# Patient Record
Sex: Female | Born: 1980 | Race: Black or African American | Hispanic: No | Marital: Single | Smoking: Never smoker
Health system: Southern US, Community
[De-identification: ages and names within clinical notes are randomized; demographics above are authoritative.]

## PROBLEM LIST (undated history)

## (undated) DIAGNOSIS — E669 Obesity, unspecified: Secondary | ICD-10-CM

## (undated) DIAGNOSIS — Z973 Presence of spectacles and contact lenses: Secondary | ICD-10-CM

## (undated) DIAGNOSIS — E119 Type 2 diabetes mellitus without complications: Secondary | ICD-10-CM

## (undated) DIAGNOSIS — O0990 Supervision of high risk pregnancy, unspecified, unspecified trimester: Secondary | ICD-10-CM

## (undated) DIAGNOSIS — Z975 Presence of (intrauterine) contraceptive device: Secondary | ICD-10-CM

## (undated) HISTORY — DX: Obesity, unspecified: E66.9

## (undated) HISTORY — DX: Presence of (intrauterine) contraceptive device: Z97.5

## (undated) HISTORY — DX: Supervision of high risk pregnancy, unspecified, unspecified trimester: O09.90

## (undated) HISTORY — DX: Presence of spectacles and contact lenses: Z97.3

## (undated) HISTORY — PX: BACK SURGERY: SHX140

## (undated) HISTORY — PX: CERVICAL CERCLAGE: SHX1329

---

## 2002-07-29 HISTORY — PX: CYST EXCISION: SHX5701

## 2004-07-29 DIAGNOSIS — Z975 Presence of (intrauterine) contraceptive device: Secondary | ICD-10-CM

## 2004-07-29 HISTORY — DX: Presence of (intrauterine) contraceptive device: Z97.5

## 2008-07-29 HISTORY — PX: DILATION AND CURETTAGE OF UTERUS: SHX78

## 2014-01-17 ENCOUNTER — Telehealth: Payer: Self-pay | Admitting: Family Medicine

## 2014-01-17 ENCOUNTER — Ambulatory Visit (INDEPENDENT_AMBULATORY_CARE_PROVIDER_SITE_OTHER): Payer: Managed Care, Other (non HMO) | Admitting: Medical

## 2014-01-17 ENCOUNTER — Encounter: Payer: Self-pay | Admitting: Medical

## 2014-01-17 VITALS — BP 110/80 | HR 60 | Temp 98.3°F | Resp 16 | Ht 65.0 in | Wt 252.0 lb

## 2014-01-17 DIAGNOSIS — F411 Generalized anxiety disorder: Secondary | ICD-10-CM

## 2014-01-17 DIAGNOSIS — F4321 Adjustment disorder with depressed mood: Secondary | ICD-10-CM

## 2014-01-17 MED ORDER — CITALOPRAM HYDROBROMIDE 40 MG PO TABS: ORAL_TABLET | ORAL | Status: DC

## 2014-01-17 NOTE — Progress Notes (Signed)
Subjective: New patient today.  Here for stress and anxiety.  Works full time at [employer].   Lives at home with her mother and 32yo daughter.   She feels overwhelmed.  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  In addition, working full time, raising 32yo daughter.   Feels pulled in so many directions.   Wants to take a leave of absence, has FMLA forms with her today. Had to take some time off work last year for same.   Her manager at work encouraged her to come in given that she has frequent crying spells, seems mentally distant from work although she is physically there.   Amy Matthews notes a breaking point though over [employer] day.   Her step father and grandmother whom she was close to (both of them) actually died the same day at the same hospital [place].   Her grandmother raised her along with her mother, so she was particularly close to her.  Given all her stressors, she doesn't feel like she has been able to grieve the losses.   Brother lives in [place] but is not helpful.  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   No prior treatment for anxiety, depression, no prior SSRIs.   No prior counseling.   Lived here in [place] 5 years.  No other recent medical care.    Objective: Gen: wd, wn, nad Psych: crying at times, pleasant, otherwise good eye contact, answers questions appropriately   Assessment: Encounter Diagnoses  Name Primary?  Marland Kitchen. Anxiety state, unspecified Yes  . Grieving      Plan: Discussed her situation, concerns, need to grieve and deal with her emotional issues.   Will refer to counseling.  discussed medication that can help symptoms.  Begin Citalopram.  Discussed risks/benefits of medication, discussed proper use, followup.  Advised she look into home health or other medical resources for her mother.  I gave her the FMLA forms back to take to counseling to help better  determine need for time off.  Recheck 3-4 wk, sooner for any reason.

## 2014-01-17 NOTE — Patient Instructions (Signed)
Counseling services   [facility] [phone] office [email or web address] [address], [place], [place] [address]  Laura Atkinson, therapist  Erik Nelson, MA, clinical psychologist  Cognitive-Behavior Therapy; Mood Disorders; Anxiety Disorders; adult and child ADHD; Family Therapy; Stress Management; personal growth, and Marital Therapy.    Dennis McKnight Ph.D., clinical psychologist Cognitive-Behavior Therapy; Mood Disorders; Anxiety Disorders; Stress     Management   [facility] [address], [place], [place] [address] [phone]   [facility] Desiree Wilkinson, psychotherapist [address] [place], [place] [address] [phone]   Elaine Talbert Ph.D., clinical psychologist [phone] office [address] [place], [place] [address] Cognitive Behavior Therapy, Depression, Bipolar, Anxiety, Grief and Loss   

## 2014-01-17 NOTE — Telephone Encounter (Signed)
PT called wanting to give [employer] copy of her records.  Monia Pouchetna is handling the FMLA for her.  I advised patient that she will need to sign a release for any records to go to [place].  It appears Vincenza HewsShane has advised pt that she needs to go to counseling and that the counselor should fill out the [facility].  Pt will bring release in tomorrow.

## 2014-01-17 NOTE — Telephone Encounter (Signed)
Vincenza HewsShane, The people from the [facility] called and they took the patients information and said they would give this patient a call and file on her insurance. CLS

## 2014-01-18 ENCOUNTER — Telehealth: Payer: Self-pay | Admitting: Family Medicine

## 2014-01-18 NOTE — Telephone Encounter (Signed)
Pt came in, brought [facility] of medical records [facility].  She gave me a new set of FMLA forms and took the [employer] [employer] forms that we had.  I explained to her that we were not gong to fill out the FMLA forms that she needed to take them to her counselor.  She said she understood, but these neeeded to go in asap.  I explained we could write on them that the counselor needed to fill out the [facility] and she said that was fine, to fax that. [facility]

## 2014-02-15 ENCOUNTER — Telehealth: Payer: Self-pay | Admitting: Medical

## 2014-02-15 ENCOUNTER — Telehealth: Payer: Self-pay | Admitting: Family Medicine

## 2014-02-15 NOTE — Telephone Encounter (Signed)
[employer] (FMLA) called about this patient. She was referred to counseling and they are suppose to fill out the FMLA forms. [employer] rep. Olegario MessierKathy ask if you could fill out the forms for the patient missing work from June 11th to June 26th. I explain to her that I wasn't for sure if you would do this. Olegario MessierKathy @ [phone] at [facility]. CLS

## 2014-02-15 NOTE — Telephone Encounter (Signed)
Pt called stating that due to FMLA with [employer] and the dates that were completing on the FMLA form, Monia Pouchetna is giving her a deadline of tomorrow morning to get get a letter to them stating that she was out of work form 01/06/14 - 01/20/14 due to stress and the same FMLA reasons. Pt says during the period of 6/11-6/25 she was trying to get in with a new doctor and get seen for the FMLA reasons.and it just took a while.Requesting a letter today so that she does not lose her job. Pt noting that this entire situation is causing her a lot of stress.

## 2014-02-16 NOTE — Telephone Encounter (Signed)
Patient goes back to work on 02/18/14. She has a follow up here with you on 02/17/14. She goes to [facility] and she see's Mila Palmeresiree Briggs , and she meets with her every Thursday. I spoke with the lady at [facility] and all she needed was a verbal over the phone that you would take care of those dates 01/06/14 to 01/20/14. CLS

## 2014-02-16 NOTE — Telephone Encounter (Signed)
I left a message for kathy at [facility] to return my phone call about this patient. CLS

## 2014-02-16 NOTE — Telephone Encounter (Signed)
Ok, I will write for the specific dates, but I don't have the forms.  Has she went back to work?  Is she now seeing the counselor?  Who, contact info, how often?  She is due back for f/u on medication, so make the appt.

## 2014-02-17 ENCOUNTER — Ambulatory Visit: Payer: Self-pay | Admitting: Medical

## 2014-02-21 ENCOUNTER — Ambulatory Visit: Payer: Self-pay | Admitting: Medical

## 2014-03-02 ENCOUNTER — Encounter: Payer: Self-pay | Admitting: Medical

## 2014-03-31 ENCOUNTER — Telehealth: Payer: Self-pay | Admitting: Medical

## 2014-03-31 NOTE — Telephone Encounter (Signed)
Please help -  I received a letter from [employer].  I can't tell if the record is stating that they won't pay for her visit or if they were trying to get more information or what.  Can you please review the letter, send whatever records they're requesting, and if need be I can get on the telephone to talk to the claims person.  There was notation that they tried to call me personally twice which I never got sent a phone call to respond to.  There is notation about them getting  In touch with our front office, something about someone being out of the office, not sure.    Either way the patient is due for a followup visit, I have only seen her once, and if we need to talk with the representative that is fine.

## 2014-04-06 NOTE — Telephone Encounter (Signed)
Letter from Monia Pouch is regarding pt's functionality and impairment, it states needs you to review the report and see if you agree with the findings or is you disagree you need to provide clinical evidence or document your opinion.  Letter on your desk. Also I left a message for pt to call and schedule an appt.

## 2014-04-08 NOTE — Telephone Encounter (Signed)
Please write a letter or call back or give some sort of correspondance to the [facility] request.  Of note, I was never made aware that Monia Pouch called as they said they called so I was unable to know to respond to them, although they mention speaking to the front office.   I am not a psychiatrist or psychologist, and as such I are documentation maybe a little different than one would see with those specialties.  I try to be thorough in the limited time I have 15 minutes with this new patient at her first and only visit so far.    Regarding how her symptoms of anxiety are affecting her functioning, she feels or was feeling overwhelmed, being pulled in multiple directions, numerous responsibilities at home including help taking care of her mother, single mom, works full-time.  Due to having random and frequent crying spells on the job, she was unable to do her day to day work without crying, being self conscious about crying spells, unable to focus on her job, feeling distant, non-engaged. Was getting behind in her work load due to the symptoms, mostly emotional and mental, not necessarily physical symptoms.  Hopefully this information will suffice with whatever information they're requesting  shane

## 2014-04-14 ENCOUNTER — Telehealth: Payer: Self-pay | Admitting: Medical

## 2014-04-14 NOTE — Telephone Encounter (Signed)
Called [employer] appeals specialist at [place] to discuss forms regarding appeal.  Per Elane Fritz due to no response from [place] so appeal was denied.  Too late to do anything else.

## 2014-04-24 NOTE — Telephone Encounter (Signed)
See below, has this been done

## 2014-04-25 NOTE — Telephone Encounter (Signed)
See note my note 04/14/14

## 2014-08-23 ENCOUNTER — Telehealth: Payer: Self-pay | Admitting: Medical

## 2014-08-23 NOTE — Telephone Encounter (Signed)
Patient states that it's time for the IUD to be removed. She has an appointment to come in on 2/4/116

## 2014-08-23 NOTE — Telephone Encounter (Signed)
LMOM TO CB. CLS 

## 2014-08-23 NOTE — Telephone Encounter (Signed)
Requesting the name of a OBGYN where she can get her IUD removed

## 2014-08-23 NOTE — Telephone Encounter (Signed)
1) I've only seen her once, so due for physical, so schedule this 2) if Mirena IUD, I can remove it, but what is her concern for having it removed?

## 2014-09-01 ENCOUNTER — Other Ambulatory Visit (HOSPITAL_COMMUNITY)
Admission: RE | Admit: 2014-09-01 | Discharge: 2014-09-01 | Disposition: A | Discharge status: Home / Self Care | Payer: BLUE CROSS/BLUE SHIELD | Source: Ambulatory Visit | Attending: Medical | Admitting: Medical

## 2014-09-01 ENCOUNTER — Ambulatory Visit (INDEPENDENT_AMBULATORY_CARE_PROVIDER_SITE_OTHER): Payer: BLUE CROSS/BLUE SHIELD | Admitting: Medical

## 2014-09-01 ENCOUNTER — Encounter: Payer: Self-pay | Admitting: Medical

## 2014-09-01 VITALS — BP 128/80 | HR 76 | Temp 98.0°F | Resp 15 | Ht 65.0 in | Wt 244.0 lb

## 2014-09-01 DIAGNOSIS — Z708 Other sex counseling: Secondary | ICD-10-CM

## 2014-09-01 DIAGNOSIS — Z719 Counseling, unspecified: Secondary | ICD-10-CM

## 2014-09-01 DIAGNOSIS — Z113 Encounter for screening for infections with a predominantly sexual mode of transmission: Secondary | ICD-10-CM | POA: Diagnosis present

## 2014-09-01 DIAGNOSIS — R81 Glycosuria: Secondary | ICD-10-CM

## 2014-09-01 DIAGNOSIS — Z01419 Encounter for gynecological examination (general) (routine) without abnormal findings: Secondary | ICD-10-CM | POA: Insufficient documentation

## 2014-09-01 DIAGNOSIS — Z1151 Encounter for screening for human papillomavirus (HPV): Secondary | ICD-10-CM | POA: Diagnosis present

## 2014-09-01 DIAGNOSIS — Z7189 Other specified counseling: Secondary | ICD-10-CM

## 2014-09-01 DIAGNOSIS — Z2821 Immunization not carried out because of patient refusal: Secondary | ICD-10-CM

## 2014-09-01 DIAGNOSIS — Z30432 Encounter for removal of intrauterine contraceptive device: Secondary | ICD-10-CM

## 2014-09-01 DIAGNOSIS — Z124 Encounter for screening for malignant neoplasm of cervix: Secondary | ICD-10-CM

## 2014-09-01 DIAGNOSIS — Z23 Encounter for immunization: Secondary | ICD-10-CM

## 2014-09-01 DIAGNOSIS — E669 Obesity, unspecified: Secondary | ICD-10-CM

## 2014-09-01 DIAGNOSIS — Z0000 Encounter for general adult medical examination without abnormal findings: Secondary | ICD-10-CM

## 2014-09-01 DIAGNOSIS — Z833 Family history of diabetes mellitus: Secondary | ICD-10-CM

## 2014-09-01 DIAGNOSIS — L680 Hirsutism: Secondary | ICD-10-CM

## 2014-09-01 LAB — CBC
HEMATOCRIT: 43.5 % (ref 36.0–46.0)
HEMOGLOBIN: 14.3 g/dL (ref 12.0–15.0)
MCH: 29.7 pg (ref 26.0–34.0)
MCHC: 32.9 g/dL (ref 30.0–36.0)
MCV: 90.4 fL (ref 78.0–100.0)
MPV: 11 fL (ref 8.6–12.4)
PLATELETS: 302 10*3/uL (ref 150–400)
RBC: 4.81 MIL/uL (ref 3.87–5.11)
RDW: 13 % (ref 11.5–15.5)
WBC: 4.5 10*3/uL (ref 4.0–10.5)

## 2014-09-01 LAB — POCT URINALYSIS DIPSTICK
Bilirubin, UA: NEGATIVE
KETONES UA: NEGATIVE
LEUKOCYTES UA: NEGATIVE
Nitrite, UA: NEGATIVE
PROTEIN UA: NEGATIVE
Spec Grav, UA: >=1.03
Urobilinogen, UA: NEGATIVE
pH, UA: 6

## 2014-09-01 LAB — POCT URINE PREGNANCY: Preg Test, Ur: NEGATIVE

## 2014-09-01 NOTE — Progress Notes (Addendum)
Subjective:   HPI  Amy Matthews is a 34 y.o. female who presents for a complete physical.  Accompanied by her boyfriend.   Works at [employer], has young daughter.   Preventative care: Last ophthalmology visit:2016 [facility] Last dental visit: [facility] 2014 Last colonoscopy:never Last mammogram:never Last gynecological exam:2 years ago, pap normal then.  No prior abnormal pap Last JXB:JYNWGEKG:never Last labs:2016  Prior vaccinations: TD or Tdap:current Influenza:no, declines Pneumococcal: no Shingles/Zostavax:no  Concerns: Wants IUD removed.   Wants a little break from contraception.  No particular reason or concern.  Not wanting to restart contraception at this time.   Is sexually active.   Reviewed their medical, surgical, family, social, medication, and allergy history and updated chart as appropriate.  Past Medical History  Diagnosis Date  . Wears contact lenses   . Obesity   . HRP (high risk pregnancy)     prolonged hospitalization, cerclage done  . IUD (intrauterine device) in place 2006    Mirena    Past Surgical History  Procedure Laterality Date  . Dilation and curettage of uterus  2010    due to miscarriage  . Cyst excision  2004    under right breast  . Cervical cerclage      History   Social History  . Marital Status: Single    Spouse Name: N/A    Number of Children: N/A  . Years of Education: N/A   Occupational History  . Not on file.   Social History Main Topics  . Smoking status: Never Smoker   . Smokeless tobacco: Not on file  . Alcohol Use: Yes     Comment: social  . Drug Use: No  . Sexual Activity: Not on file   Other Topics Concern  . Not on file   Social History Narrative   Lives with boyfriend, has 1 daughter 5yo as of 07/2014.  Exercise - not currently but planning to 2016.   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 Kidney disease Mother      dialysis  . Diabetes Mother   . Ulcers Father   . Heart disease Maternal Grandmother   . Other Maternal Grandfather     old age  . Dementia Paternal Grandmother   . Hypertension Neg Hx   . Hyperlipidemia Neg Hx   . Stroke Neg Hx   . Cancer Other     breast     Current outpatient prescriptions:  .  citalopram (CELEXA) 40 MG tablet, 1/2 tablet QHS x 1wk, then 1 tablet po QHS (Patient not taking: Reported on 09/01/2014), Disp: 30 tablet, Rfl: 2  No Known Allergies   Review of Systems Constitutional: -fever, -chills, -sweats, -unexpected weight change, -decreased appetite, -fatigue Allergy: -sneezing, -itching, -congestion Dermatology: -changing moles, --rash, -lumps ENT: -runny nose, -ear pain, -sore throat, -hoarseness, -sinus pain, -teeth pain, - ringing in ears, -hearing loss, -nosebleeds Cardiology: -chest pain, -palpitations, -swelling, -difficulty breathing when lying flat, -waking up short of breath Respiratory: -cough, -shortness of breath, -difficulty breathing with exercise or exertion, -wheezing, -coughing up blood Gastroenterology: -abdominal pain, -nausea, -vomiting, -diarrhea, -constipation, -blood in stool, -changes in bowel movement, -difficulty swallowing or eating Hematology: -bleeding, -bruising  Musculoskeletal: -joint aches, -muscle aches, -joint swelling, -back pain, -neck pain, -cramping, -changes in gait Ophthalmology: denies vision changes, eye redness, itching, discharge Urology: -burning with urination, -difficulty urinating, -blood in urine, -urinary frequency, -urgency, -incontinence  Neurology: -headache, -weakness, -tingling, -numbness, -memory loss, -falls, -dizziness Psychology: -depressed mood, -agitation, -sleep problems     Objective:   Physical Exam  Temp(Src) 98 F (36.7 C) (Oral)  Ht  (1.651 m)  Wt 244 lb (110.678 kg)  BMI 40.60 kg/m2  General appearance: alert, no distress, WD/WN, obese AA female Skin: increased body hair -  face/female beard pattern, abdomen with increased hair, lower left neck and mid chest across sternum with slightly darker pigmentation unchanged since birth per patient HEENT: normocephalic, conjunctiva/corneas normal, sclerae anicteric, PERRLA, EOMi, nares patent, no discharge or erythema, pharynx normal Oral cavity: MMM, tongue normal, teeth normal, in good repair Neck: supple, no lymphadenopathy, no thyromegaly, no masses, normal ROM, no bruits Chest: non tender, normal shape and expansion Heart: RRR, normal S1, S2, no murmurs Lungs: CTA bilaterally, no wheezes, rhonchi, or rales Abdomen: +bs, soft, non tender, non distended, no masses, no hepatomegaly, no splenomegaly, no bruits Back: non tender, normal ROM, no scoliosis Musculoskeletal: upper extremities non tender, no obvious deformity, normal ROM throughout, lower extremities non tender, no obvious deformity, normal ROM throughout Extremities: no edema, no cyanosis, no clubbing Pulses: 2+ symmetric, upper and lower extremities, normal cap refill Neurological: alert, oriented x 3, CN2-12 intact, strength normal upper extremities and lower extremities, sensation normal throughout, DTRs 2+ throughout, no cerebellar signs, gait normal Psychiatric: normal affect, behavior normal, pleasant  Breast: nontender, no masses or lumps, no skin changes, no nipple discharge or inversion, no axillary lymphadenopathy Gyn: Normal external genitalia without lesions, vagina with normal mucosa, cervix without lesions, no cervical motion tenderness, no abnormal vaginal discharge.  Uterus and adnexa not enlarged, nontender, no masses.  IUD strings in place.   Pap performed.  Exam chaperoned by nurse. Rectal: deferred    Assessment and Plan :    Encounter Diagnoses  Name Primary?  . Encounter for health maintenance examination in adult Yes  . Encounter for IUD removal   . Screening for cervical cancer   . Obesity   . Family history of diabetes mellitus in  first degree relative   . Screen for STD (sexually transmitted disease)   . Counseling on sexually transmitted disease   . Health counseling   . Hirsutism   . Need for Tdap vaccination   . Influenza vaccination declined by patient      Physical exam - discussed healthy lifestyle, diet, exercise, preventative care, vaccinations, and addressed their concerns.  Handout given. See your eye doctor yearly for routine vision care. See your dentist yearly for routine dental care including hygiene visits twice yearly. Routine labs today, STD screening labs, pap sent Obesity - discussed need to work on healthy diet, exercise, weight loss Discussed STD prevention, safe sex, contraception Counseled on the Tdap (tetanus, diptheria, and acellular pertussis) vaccine.  Vaccine information sheet given. Tdap vaccine given after consent obtained. Declines flu vaccine despite the benefits.    Procedure: IUD - discussed contraception.  This month marks 5 years of IUD in place per patient, Mirena.  She consents for removal.  discussed risks/benefits of procedure. Cleaned and prepped the cervix with Betadine, used ring forceps with steady tension to remove the IUD.  IUD removed successfully with ring forceps.   Patient tolerated procedure without c/o.  Follow-up pending labs

## 2014-09-01 NOTE — Addendum Note (Signed)
Addended by: Lilli LightLOMAX, WENDY G on: 09/01/2014 10:34 AM   Modules accepted: Orders

## 2014-09-01 NOTE — Patient Instructions (Signed)
Thank you for giving me the opportunity to serve you today.    Your diagnosis today includes: Encounter Diagnoses  Name Primary?  . Encounter for health maintenance examination in adult Yes  . Encounter for IUD removal   . Screening for cervical cancer   . Obesity   . Family history of diabetes mellitus in first degree relative   . Screen for STD (sexually transmitted disease)   . Counseling on sexually transmitted disease   . Health counseling   . Hirsutism   . Need for Tdap vaccination   . Influenza vaccination declined by patient   . Glucosuria     Specific recommendations today include: See your dentist yearly for routine dental care including hygiene visits twice yearly. See your eye doctor yearly for routine vision care. We updated your tetanus diphtheria and pertussis vaccine today We will call with lab results and Pap smear I am concerned about diabetes as you have sugar in the urine today I certainly recommend eating a low-fat healthy diet, exercising daily, and working on weight loss efforts Consider condoms and other backup methods of contraception since the IUD is now removed You may experience a little bleeding, or spotting vaginally as well as some cramping the next day or 2. This is normal. However if severe pain, heavy bleeding then return right away. Can take a little ibuprofen the next few days There was hirsutism today which included a handout below about this. This could be related to ovarian cyst or impaired sugar. We will call with lab results and other recommendations  Return pending labs.    I have included other useful information below for your review.   Contraceptive Barrier Methods A barrier method is a type of birth control (contraception) that is used to prevent pregnancy. These methods include:   Female condom.   Female condom.   Diaphragm.   Cervical cap.   Sponge.   Spermicide.  Your health care provider can help you decide  what form of contraception is best for you. Always keep in mind the risks of sexually transmitted infections (STIs).  FEMALE CONDOM A female condom is a thin sheath (latex or rubber) that is worn over the penis during sexual intercourse. The condom prevents pregnancy by catching and stopping the sperm from reaching the uterus. Condoms may come with a spermicide on them, and they can only be worn once. Condoms should not be used with petroleum jelly, lotions, or oils. These things decrease their effectiveness. Condoms can be used with water-based lubricants. Condoms help protect against STIs. Latex and polyurethane condoms provide the best available protection against many STIs, including HIV.  FEMALE CONDOM The female condom is a soft, loose-fitting sheath that is put into the vagina before sexual intercourse. It prevents pregnancy by catching the sperm in the condom and blocking the passage of sperm to the uterus. It is intended for one-time use only. A female partner should not use a condom at the same time. The female and female condoms may stick together and break. A female condom can be inserted as long as 8 hours before intercourse. Condoms help protect against STIs.  DIAPHRAGM A diaphragm is a soft, latex, dome-shaped barrier that is placed in the vagina with spermicidal jelly before sexual intercourse. It covers the cervix, kills sperm, and blocks the passage of semen into the cervix. The diaphragm can be inserted up to 2 hours before sex. If it is inserted more than 2 hours before intercourse, then spermicide must  be applied again. The diaphragm should be left in the vagina for 6-8 hours after intercourse. It must be fitted by a health care provider. This method does not protect against STIs.  CERVICAL CAP A cervical cap is a round, soft, latex or plastic cup that is put in the vagina and fits over the cervix. It may be inserted as long as 6 hours before sexual activity. It must be left in place for at  least 6 hours after intercourse and can be left in place for as long as 48 hours. It provides continuous protection as long as it is in place, regardless of the number of intercourse acts. The cervical cap cannot be used during your period. It must be fitted by a health care provider. Cervical caps do not protect against STIs. SPONGE A sponge is a soft, circular piece of polyurethane foam that has spermicide in it. The sponge has a loop for removal. It is inserted into the vagina after wetting it and is placed over the cervix before sexual intercourse. The foam is designed to trap and absorb sperm before it enters the cervix. The spermicide kills or immobilizes sperm. The sponge offers an immediate and continuous presence of spermicide throughout a 24-hour period regardless of the number of intercourse acts. The sponge should be left in place for at least 6 hours after sex. It should not be left in for more than 24 hours, and it cannot be reused. The sponge does not protect against STIs. SPERMICIDES Spermicides are chemicals that kill or block sperm from entering the cervix and uterus. They come in the form of creams, jellies, suppositories, foam, film, or tablets. The film, tablets, and suppositories should be inserted 10 to 30 minutes before sexual intercourse so they can dissolve. They are inserted into the vagina with an applicator before having sexual intercourse. This must be repeated every time you have sexual intercourse. The use of spermicides does not protect against STIs. Document Released: 05/12/2007 Document Revised: 03/17/2013 Document Reviewed: 12/27/2012 [facility] Patient Information 2015 [place]. This information is not intended to replace advice given to you by your health care provider. Make sure you discuss any questions you have with your health care provider.    Contraception Choices Contraception (birth control) is the use of any methods or devices to prevent pregnancy. Below  are some methods to help avoid pregnancy. HORMONAL METHODS   Contraceptive implant. This is a thin, plastic tube containing progesterone hormone. It does not contain estrogen hormone. Your health care provider inserts the tube in the inner part of the upper arm. The tube can remain in place for up to 3 years. After 3 years, the implant must be removed. The implant prevents the ovaries from releasing an egg (ovulation), thickens the cervical mucus to prevent sperm from entering the uterus, and thins the lining of the inside of the uterus.  Progesterone-only injections. These injections are given every 3 months by your health care provider to prevent pregnancy. This synthetic progesterone hormone stops the ovaries from releasing eggs. It also thickens cervical mucus and changes the uterine lining. This makes it harder for sperm to survive in the uterus.  Birth control pills. These pills contain estrogen and progesterone hormone. They work by preventing the ovaries from releasing eggs (ovulation). They also cause the cervical mucus to thicken, preventing the sperm from entering the uterus. Birth control pills are prescribed by a health care provider.Birth control pills can also be used to treat heavy periods.  Minipill. This type of birth control pill contains only the progesterone hormone. They are taken every day of each month and must be prescribed by your health care provider.  Birth control patch. The patch contains hormones similar to those in birth control pills. It must be changed once a week and is prescribed by a health care provider.  Vaginal ring. The ring contains hormones similar to those in birth control pills. It is left in the vagina for 3 weeks, removed for 1 week, and then a new one is put back in place. The patient must be comfortable inserting and removing the ring from the vagina.A health care provider's prescription is necessary.  Emergency contraception. Emergency contraceptives  prevent pregnancy after unprotected sexual intercourse. This pill can be taken right after sex or up to 5 days after unprotected sex. It is most effective the sooner you take the pills after having sexual intercourse. Most emergency contraceptive pills are available without a prescription. Check with your pharmacist. Do not use emergency contraception as your only form of birth control. BARRIER METHODS   Female condom. This is a thin sheath (latex or rubber) that is worn over the penis during sexual intercourse. It can be used with spermicide to increase effectiveness.  Female condom. This is a soft, loose-fitting sheath that is put into the vagina before sexual intercourse.  Diaphragm. This is a soft, latex, dome-shaped barrier that must be fitted by a health care provider. It is inserted into the vagina, along with a spermicidal jelly. It is inserted before intercourse. The diaphragm should be left in the vagina for 6 to 8 hours after intercourse.  Cervical cap. This is a round, soft, latex or plastic cup that fits over the cervix and must be fitted by a health care provider. The cap can be left in place for up to 48 hours after intercourse.  Sponge. This is a soft, circular piece of polyurethane foam. The sponge has spermicide in it. It is inserted into the vagina after wetting it and before sexual intercourse.  Spermicides. These are chemicals that kill or block sperm from entering the cervix and uterus. They come in the form of creams, jellies, suppositories, foam, or tablets. They do not require a prescription. They are inserted into the vagina with an applicator before having sexual intercourse. The process must be repeated every time you have sexual intercourse. INTRAUTERINE CONTRACEPTION  Intrauterine device (IUD). This is a T-shaped device that is put in a woman's uterus during a menstrual period to prevent pregnancy. There are 2 types:  Copper IUD. This type of IUD is wrapped in copper  wire and is placed inside the uterus. Copper makes the uterus and fallopian tubes produce a fluid that kills sperm. It can stay in place for 10 years.  Hormone IUD. This type of IUD contains the hormone progestin (synthetic progesterone). The hormone thickens the cervical mucus and prevents sperm from entering the uterus, and it also thins the uterine lining to prevent implantation of a fertilized egg. The hormone can weaken or kill the sperm that get into the uterus. It can stay in place for 3-5 years, depending on which type of IUD is used. PERMANENT METHODS OF CONTRACEPTION  Female tubal ligation. This is when the woman's fallopian tubes are surgically sealed, tied, or blocked to prevent the egg from traveling to the uterus.  Hysteroscopic sterilization. This involves placing a small coil or insert into each fallopian tube. Your doctor uses a technique called hysteroscopy to  do the procedure. The device causes scar tissue to form. This results in permanent blockage of the fallopian tubes, so the sperm cannot fertilize the egg. It takes about 3 months after the procedure for the tubes to become blocked. You must use another form of birth control for these 3 months.  Female sterilization. This is when the female has the tubes that carry sperm tied off (vasectomy).This blocks sperm from entering the vagina during sexual intercourse. After the procedure, the man can still ejaculate fluid (semen). NATURAL PLANNING METHODS  Natural family planning. This is not having sexual intercourse or using a barrier method (condom, diaphragm, cervical cap) on days the woman could become pregnant.  Calendar method. This is keeping track of the length of each menstrual cycle and identifying when you are fertile.  Ovulation method. This is avoiding sexual intercourse during ovulation.  Symptothermal method. This is avoiding sexual intercourse during ovulation, using a thermometer and ovulation  symptoms.  Post-ovulation method. This is timing sexual intercourse after you have ovulated. Regardless of which type or method of contraception you choose, it is important that you use condoms to protect against the transmission of sexually transmitted infections (STIs). Talk with your health care provider about which form of contraception is most appropriate for you. Document Released: 07/15/2005 Document Revised: 07/20/2013 Document Reviewed: 01/07/2013 [facility] Patient Information 2015 [place], [place]. This information is not intended to replace advice given to you by your health care provider. Make sure you discuss any questions you have with your health care provider.   Hirsutism and Masculinization Hirsutism (increased body hair) is the growth of colored (pigmented) thick hair in women. It is most noticeable when it is on the moustache or beard areas. The other common sites are the:  Chest.  Abdomen.  Thighs.  Back. Pubic hair growth may run upward from the usual bikini line to the middle of the abdomen.  Virilism (masculinization) is more extensive than hirsutism. It has extra symptoms. There may be:  Acne.  Oily skin.  Baldness.  Enlargement of the clitoris.  Increased sex drive (libido).  Voice deepening.  Reduced breast size.  Irregular or absent periods.  Aggression. The scalp hair growth may also bald in a typical female pattern. CAUSES  This is caused by too much female sex hormone (androgen) in the body. It can be produced by the ovaries, adrenal glands, and within the skin. Hirsutism is most commonly related to polycystic ovarian syndrome (PCOS). The first signs of increased androgen levels are hirsutism and acne. How sensitive each person is to hormone levels varies greatly. Virilism may result from higher androgen levels. Some women with hirsutism have normal hormone levels. Eventually there may be female pattern balding. These problems are also connected to  difficulty in having children (infertility). In addition, both malignant and benign tumors may cause hirsutism such as tumors of the adrenal gland (adenomas or adenocarcinomas) but this is a rare cause. There is also evidence that insulin resistance may cause the androgynism. This problem is treated with some success with a medicine for diabetes (metformin). Causes that come from outside the body (exogenous) may also lead to hirsutism such as intake of androgens by mouth.  Note: Women of [place], and [place] heritage commonly have facial, abdominal, and thigh hair that is normal for them.  TREATMENT  There are medical treatments that inhibit these conditions, such as:  Combined oral contraceptive pills, if you are not trying to become pregnant.  Medicines that  stop the production of hormones (gonadotropins).  Steroids. This may be used if there is evidence of congenital (present since birth) adrenal hyperplasia (abnormal growth of cells).  Metformin for the treatment of virilization, if sensitive to insulin.  Suppression of ovarian hormone production with GnRH analogues (a hormone). They can only be used by themselves for short periods of time. There are a variety of cosmetic treatments. These may be all that you need. They may be effective against occasional problems.  Shaving is the simplest and most effective in the short term. Bleaching is not usually suitable for severe hirsutism.  Plucking, waxing, sugaring (similar to waxing), and depilatory creams are effective. However, on occasion, they can result in skin irritation (inflammation) or infection.  Electrolysis is effective. Your caregiver can help you decide what needs to be done and what course of treatment will be best for you. Your caregiver may refer you to an endocrinologist. This is a physician who is specialized in the treatment of glandular disorders. Document Released: 09/23/2001 Document  Revised: 11/29/2013 Document Reviewed: 11/09/2008 [facility] Patient Information 2015 [place], [place]. This information is not intended to replace advice given to you by your health care provider. Make sure you discuss any questions you have with your health care provider.

## 2014-09-02 LAB — LIPID PANEL
CHOL/HDL RATIO: 6.2 ratio
CHOLESTEROL: 237 mg/dL — AB (ref 0–200)
HDL: 38 mg/dL — ABNORMAL LOW (ref >39)
LDL CALC: 183 mg/dL — AB (ref 0–99)
Triglycerides: 82 mg/dL (ref <150)
VLDL: 16 mg/dL (ref 0–40)

## 2014-09-02 LAB — COMPREHENSIVE METABOLIC PANEL
ALBUMIN: 4.3 g/dL (ref 3.5–5.2)
ALT: 17 U/L (ref 0–35)
AST: 12 U/L (ref 0–37)
Alkaline Phosphatase: 84 U/L (ref 39–117)
BUN: 11 mg/dL (ref 6–23)
CO2: 27 mEq/L (ref 19–32)
Calcium: 9.8 mg/dL (ref 8.4–10.5)
Chloride: 96 mEq/L (ref 96–112)
Creat: 0.92 mg/dL (ref 0.50–1.10)
Glucose, Bld: 321 mg/dL — ABNORMAL HIGH (ref 70–99)
POTASSIUM: 4.4 meq/L (ref 3.5–5.3)
Sodium: 135 mEq/L (ref 135–145)
Total Bilirubin: 0.7 mg/dL (ref 0.2–1.2)
Total Protein: 8 g/dL (ref 6.0–8.3)

## 2014-09-02 LAB — HEMOGLOBIN A1C
Hgb A1c MFr Bld: 12.3 % — ABNORMAL HIGH (ref <5.7)
Mean Plasma Glucose: 306 mg/dL — ABNORMAL HIGH (ref <117)

## 2014-09-02 LAB — HIV ANTIBODY (ROUTINE TESTING W REFLEX): HIV 1&2 Ab, 4th Generation: NONREACTIVE

## 2014-09-02 LAB — TSH: TSH: 1.755 u[IU]/mL (ref 0.350–4.500)

## 2014-09-02 LAB — RPR

## 2014-09-06 LAB — CYTOLOGY - PAP

## 2014-09-19 ENCOUNTER — Encounter: Payer: Self-pay | Admitting: Medical

## 2014-09-19 ENCOUNTER — Ambulatory Visit (INDEPENDENT_AMBULATORY_CARE_PROVIDER_SITE_OTHER): Payer: BLUE CROSS/BLUE SHIELD | Admitting: Medical

## 2014-09-19 VITALS — BP 100/80 | HR 93 | Temp 98.7°F | Resp 15 | Wt 245.0 lb

## 2014-09-19 DIAGNOSIS — J209 Acute bronchitis, unspecified: Secondary | ICD-10-CM

## 2014-09-19 MED ORDER — BENZONATATE 200 MG PO CAPS: 200.0000 mg | ORAL_CAPSULE | Freq: Three times a day (TID) | ORAL | Status: DC | PRN

## 2014-09-19 MED ORDER — ALBUTEROL SULFATE HFA 108 (90 BASE) MCG/ACT IN AERS
2.0000 | INHALATION_SPRAY | Freq: Four times a day (QID) | RESPIRATORY_TRACT | Status: DC | PRN
Start: 2014-09-19 — End: 2019-07-29

## 2014-09-19 NOTE — Progress Notes (Signed)
Subjective:  Amy Matthews is a 34 y.o. female who presents for f/u on bronchitis.  [facility]  Was prescribed Augmentin, nebulized albuterol, medrol dosepak.  Had been having 1 week hx/o cough, congestion, chest pressure.   Feeling much.  Was advised to f/u here.  Hasn't had fever at all.   In the last day or 2, some cough, some wheezing.   She is a nonsmoker.   [place] smoke that triggered her lungs.   No sick contacts.  Needs clearance to return to work.  In the last 2 days using albuterol nebulized 2-3 times daily.  No other aggravating or relieving factors. No other complaint.  The following portions of the patient's history were reviewed and updated as appropriate: allergies, current medications, past family history, past medical history, past social history, past surgical history and problem list.  ROS as in subjective  Past Medical History  Diagnosis Date  . Wears contact lenses   . Obesity   . HRP (high risk pregnancy)     prolonged hospitalization, cerclage done  . IUD (intrauterine device) in place 2006    Mirena     Objective: BP 100/80 mmHg  Pulse 93  Temp(Src) 98.7 F (37.1 C) (Oral)  Resp 15  Wt 245 lb (111.131 kg)  SpO2 98%   General appearance: Alert, WD/WN, no distress                            Skin: warm, no rash, no diaphoresis                           Head: no sinus tenderness                            Eyes: conjunctiva normal, corneas clear, PERRLA                            Ears: pearly TMs, external ear canals normal                          Nose: septum midline, turbinates swollen, with erythema and clear discharge             Mouth/throat: MMM, tongue normal, mild pharyngeal erythema                           Neck: supple, no adenopathy, no thyromegaly, nontender                          Heart: RRR, normal S1, S2, no murmurs  Lungs: +bronchial breath sounds, few faint wheezes, no rhonchi, no rales                Extremities: no edema, nontender     Assessment: Encounter Diagnosis  Name Primary?  . Acute bronchitis, unspecified organism Yes      Plan:  I reviewed the emergency primary record she has with her. Medication orders today include: Albuterol handheld inhaler for use when not at home.  Tessalon perles prn cough.  Finish out the antibiotic. Went home she can use the nebulized albuterol. Continue to use relative rest, hydration, finish out  the Medrol Dosepak.   symptoms should gradually improved.  Call/return in 2-3 days if symptoms are worse or not improving.  Advised that cough may linger even after the infection is improved.

## 2014-10-11 ENCOUNTER — Emergency Department (HOSPITAL_BASED_OUTPATIENT_CLINIC_OR_DEPARTMENT_OTHER)
Admission: EM | Admit: 2014-10-11 | Discharge: 2014-10-11 | Disposition: A | Discharge status: Home / Self Care | Payer: BLUE CROSS/BLUE SHIELD | Attending: Emergency Medicine | Admitting: Emergency Medicine

## 2014-10-11 ENCOUNTER — Encounter (HOSPITAL_BASED_OUTPATIENT_CLINIC_OR_DEPARTMENT_OTHER): Payer: Self-pay | Admitting: *Deleted

## 2014-10-11 DIAGNOSIS — Z973 Presence of spectacles and contact lenses: Secondary | ICD-10-CM | POA: Insufficient documentation

## 2014-10-11 DIAGNOSIS — Z79899 Other long term (current) drug therapy: Secondary | ICD-10-CM | POA: Insufficient documentation

## 2014-10-11 DIAGNOSIS — H65192 Other acute nonsuppurative otitis media, left ear: Secondary | ICD-10-CM | POA: Insufficient documentation

## 2014-10-11 DIAGNOSIS — R51 Headache: Secondary | ICD-10-CM | POA: Insufficient documentation

## 2014-10-11 DIAGNOSIS — Z792 Long term (current) use of antibiotics: Secondary | ICD-10-CM | POA: Insufficient documentation

## 2014-10-11 DIAGNOSIS — H6592 Unspecified nonsuppurative otitis media, left ear: Secondary | ICD-10-CM

## 2014-10-11 DIAGNOSIS — E669 Obesity, unspecified: Secondary | ICD-10-CM | POA: Diagnosis not present

## 2014-10-11 DIAGNOSIS — H9202 Otalgia, left ear: Secondary | ICD-10-CM | POA: Diagnosis present

## 2014-10-11 MED ORDER — HYDROCODONE-ACETAMINOPHEN 5-325 MG PO TABS
2.0000 | ORAL_TABLET | Freq: Once | ORAL | Status: AC
  Administered 2014-10-11: 2 via ORAL
  Filled 2014-10-11: qty 2

## 2014-10-11 MED ORDER — AMOXICILLIN 500 MG PO CAPS
1000.0000 mg | ORAL_CAPSULE | Freq: Once | ORAL | Status: AC
  Administered 2014-10-11: 1000 mg via ORAL
  Filled 2014-10-11: qty 2

## 2014-10-11 MED ORDER — HYDROCODONE-ACETAMINOPHEN 5-325 MG PO TABS: 1.0000 | ORAL_TABLET | Freq: Four times a day (QID) | ORAL | Status: DC | PRN

## 2014-10-11 MED ORDER — AMOXICILLIN 500 MG PO TABS: 1000.0000 mg | ORAL_TABLET | Freq: Two times a day (BID) | ORAL | Status: DC

## 2014-10-11 NOTE — ED Provider Notes (Signed)
CSN: [id number]     Arrival date & time 10/11/14  2142 History  This chart was scribed for Elwin Mocha, MD by Gwenyth Ober, ED Scribe. This patient was seen in room MH07/MH07 and the patient's care was started at 10:00 PM.    Chief Complaint  Patient presents with  . Otalgia   Patient is a 34 y.o. female presenting with ear pain. The history is provided by the patient. No language interpreter was used.  Otalgia Location:  Left Behind ear:  No abnormality Quality:  Pressure Severity:  Mild Onset quality:  Gradual Duration:  1 day Timing:  Constant Progression:  Unchanged Chronicity:  New Relieved by:  None tried Worsened by:  Nothing tried Ineffective treatments:  None tried Associated symptoms: headaches and hearing loss   Associated symptoms: no fever     HPI Comments: Amy Matthews is a 34 y.o. female who presents to the Emergency Department complaining of constant, moderate pressure-like pain in her left ear that started this morning. She notes left temple and forehead pain and decreased hearing from her left ear as associated symptoms. Pt reports pain becomes sharp with belching. She denies pain with swallowing, right ear pain and fever as associated symptoms.  Past Medical History  Diagnosis Date  . Wears contact lenses   . Obesity   . HRP (high risk pregnancy)     prolonged hospitalization, cerclage done  . IUD (intrauterine device) in place 2006    Mirena   Past Surgical History  Procedure Laterality Date  . Dilation and curettage of uterus  2010    due to miscarriage  . Cyst excision  2004    under right breast  . Cervical cerclage     Family History  Problem Relation Age of Onset  . Kidney disease Mother     dialysis  . Diabetes Mother   . Ulcers Father   . Heart disease Maternal Grandmother   . Other Maternal Grandfather     old age  . Dementia Paternal Grandmother   . Hypertension Neg Hx   . Hyperlipidemia Neg Hx   . Stroke Neg Hx   . Cancer  Other     breast   History  Substance Use Topics  . Smoking status: Never Smoker   . Smokeless tobacco: Not on file  . Alcohol Use: Yes     Comment: social   OB History    No data available     Review of Systems  Constitutional: Negative for fever.  HENT: Positive for ear pain and hearing loss.   Neurological: Positive for headaches.  All other systems reviewed and are negative.  Allergies  Review of patient's allergies indicates no known allergies.  Home Medications   Prior to Admission medications   Medication Sig Start Date End Date Taking? Authorizing Provider  albuterol (PROVENTIL HFA;VENTOLIN HFA) 108 (90 BASE) MCG/ACT inhaler Inhale 2 puffs into the lungs every 6 (six) hours as needed for wheezing or shortness of breath. 09/19/14   Kermit Balo Tysinger, PA-C  albuterol (PROVENTIL) (2.5 MG/3ML) 0.083% nebulizer solution Take 2.5 mg by nebulization every 6 (six) hours as needed for wheezing or shortness of breath.    Historical Provider, MD  amoxicillin-clavulanate (AUGMENTIN) 875-125 MG per tablet Take 1 tablet by mouth 2 (two) times daily.    Historical Provider, MD  benzonatate (TESSALON) 200 MG capsule Take 1 capsule (200 mg total) by mouth 3 (three) times daily as needed for cough. 09/19/14   Kermit Balo  Tysinger, PA-C  citalopram (CELEXA) 40 MG tablet 1/2 tablet QHS x 1wk, then 1 tablet po QHS Patient not taking: Reported on 09/01/2014 01/17/14   Kermit Baloavid S Tysinger, PA-C  methylPREDNISolone (MEDROL DOSEPAK) 4 MG tablet Take by mouth. follow package directions    Historical Provider, MD   BP 134/106 mmHg  Pulse 109  Temp(Src) 98.4 F (36.9 C) (Oral)  Resp 20  Ht 5' 5.5" (1.664 m)  Wt 245 lb (111.131 kg)  BMI 40.14 kg/m2  SpO2 99%  LMP 09/20/2014 Physical Exam  Constitutional: She is oriented to person, place, and time. She appears well-developed and well-nourished. No distress.  HENT:  Head: Normocephalic.  Right Ear: Tympanic membrane normal.  Left Ear: Tympanic membrane  is erythematous. A middle ear effusion is present.  Mouth/Throat: Oropharynx is clear and moist.  Eyes: Pupils are equal, round, and reactive to light.  Neck: Neck supple.  Cardiovascular: Normal rate, regular rhythm and normal heart sounds.   Pulmonary/Chest: Effort normal and breath sounds normal. No respiratory distress. She has no wheezes.  Abdominal: Soft. She exhibits no distension. There is no tenderness. There is no rebound and no guarding.  Musculoskeletal: She exhibits no edema or tenderness.  Neurological: She is alert and oriented to person, place, and time.  Skin: Skin is warm and dry.  Psychiatric: She has a normal mood and affect.  Nursing note and vitals reviewed.   ED Course  Procedures   DIAGNOSTIC STUDIES: Oxygen Saturation is 99% on RA, normal by my interpretation.    COORDINATION OF CARE: 10:10 PM Discussed treatment plan with pt at bedside and pt agreed to plan.  Labs Review Labs Reviewed - No data to display  Imaging Review No results found.   EKG Interpretation None      MDM   Final diagnoses:  Left otitis media with effusion    34 year old female with left ear pain. Exam consistent with otitis. Put on amoxicillin. Given pain medicine. Stable for discharge.  I personally performed the services described in this documentation, which was scribed in my presence. The recorded information has been reviewed and is accurate.     Elwin MochaBlair Meeka Cartelli, MD 10/12/14 [phone]

## 2014-10-11 NOTE — Discharge Instructions (Signed)

## 2014-10-11 NOTE — ED Notes (Signed)
Woke with pain in her left ear. Ear feels clogged. Facial pain.

## 2014-10-12 ENCOUNTER — Telehealth: Payer: Self-pay | Admitting: Family Medicine

## 2014-10-12 NOTE — Telephone Encounter (Signed)
ER letter sent 

## 2015-03-11 ENCOUNTER — Encounter (HOSPITAL_BASED_OUTPATIENT_CLINIC_OR_DEPARTMENT_OTHER): Payer: Self-pay | Admitting: *Deleted

## 2015-03-11 ENCOUNTER — Emergency Department (HOSPITAL_BASED_OUTPATIENT_CLINIC_OR_DEPARTMENT_OTHER)
Admission: EM | Admit: 2015-03-11 | Discharge: 2015-03-12 | Disposition: A | Discharge status: Home / Self Care | Payer: BLUE CROSS/BLUE SHIELD | Attending: Emergency Medicine | Admitting: Emergency Medicine

## 2015-03-11 DIAGNOSIS — Z973 Presence of spectacles and contact lenses: Secondary | ICD-10-CM | POA: Insufficient documentation

## 2015-03-11 DIAGNOSIS — E669 Obesity, unspecified: Secondary | ICD-10-CM | POA: Insufficient documentation

## 2015-03-11 DIAGNOSIS — Z79899 Other long term (current) drug therapy: Secondary | ICD-10-CM | POA: Insufficient documentation

## 2015-03-11 DIAGNOSIS — Z792 Long term (current) use of antibiotics: Secondary | ICD-10-CM | POA: Insufficient documentation

## 2015-03-11 DIAGNOSIS — M62830 Muscle spasm of back: Secondary | ICD-10-CM | POA: Insufficient documentation

## 2015-03-11 MED ORDER — METHOCARBAMOL 500 MG PO TABS
1000.0000 mg | ORAL_TABLET | Freq: Once | ORAL | Status: AC
  Administered 2015-03-11: 1000 mg via ORAL
  Filled 2015-03-11: qty 2

## 2015-03-11 MED ORDER — KETOROLAC TROMETHAMINE 60 MG/2ML IM SOLN
60.0000 mg | Freq: Once | INTRAMUSCULAR | Status: AC
Start: 2015-03-11 — End: 2015-03-11
  Administered 2015-03-11: 60 mg via INTRAMUSCULAR
  Filled 2015-03-11: qty 2

## 2015-03-11 MED ORDER — DEXAMETHASONE SODIUM PHOSPHATE 10 MG/ML IJ SOLN
10.0000 mg | Freq: Once | INTRAMUSCULAR | Status: AC
Start: 2015-03-11 — End: 2015-03-11
  Administered 2015-03-11: 10 mg via INTRAMUSCULAR
  Filled 2015-03-11: qty 1

## 2015-03-11 NOTE — ED Notes (Signed)
Pt here with lower back pain since yesterday (unrelieved by heat therapy at home).  Pain began this am and she feels it is due to her moving a TV yesterday.  No radiating of the pain, no weakness or numbness.

## 2015-03-11 NOTE — ED Provider Notes (Signed)
CSN: [id number]     Arrival date & time 03/11/15  2210 History  This chart was scribed for Jamonta Goerner, MD by Placido Sou, ED scribe. This patient was seen in room MH09/MH09 and the patient's care was started at 11:37 PM.   Chief Complaint  Patient presents with  . Back Pain   Patient is a 34 y.o. female presenting with back pain. The history is provided by the patient. No language interpreter was used.  Back Pain Location:  Lumbar spine Quality:  Stabbing Radiates to:  Does not radiate Pain severity:  Moderate Pain is:  Same all the time Onset quality:  Sudden Duration:  1 day Timing:  Constant Progression:  Unchanged Chronicity:  New Context: lifting heavy objects   Relieved by:  Nothing Worsened by:  Bending, movement, twisting and ambulation Ineffective treatments:  Heating pad, ibuprofen and NSAIDs Associated symptoms: no abdominal pain, no abdominal swelling, no bladder incontinence, no bowel incontinence, no chest pain, no dysuria, no fever, no headaches, no leg pain, no numbness, no paresthesias, no pelvic pain, no perianal numbness, no tingling, no weakness and no weight loss   Risk factors: not pregnant    HPI Comments: Amy Matthews is a 34 y.o. female who presents to the Emergency Department complaining of constant, moderate, lower back pain with onset 1 day ago. Pt notes that she was lifting a large 32" non-flat screen television yesterday before the onset of her symptoms. She notes applying heat, icy hot and taking ibuprofen and tylenol with no relief. She denies any shx or prior injuries to her back.   Past Medical History  Diagnosis Date  . Wears contact lenses   . Obesity   . HRP (high risk pregnancy)     prolonged hospitalization, cerclage done  . IUD (intrauterine device) in place 2006    Mirena   Past Surgical History  Procedure Laterality Date  . Dilation and curettage of uterus  2010    due to miscarriage  . Cyst excision  2004    under right  breast  . Cervical cerclage     Family History  Problem Relation Age of Onset  . Kidney disease Mother     dialysis  . Diabetes Mother   . Ulcers Father   . Heart disease Maternal Grandmother   . Other Maternal Grandfather     old age  . Dementia Paternal Grandmother   . Hypertension Neg Hx   . Hyperlipidemia Neg Hx   . Stroke Neg Hx   . Cancer Other     breast   Social History  Substance Use Topics  . Smoking status: Never Smoker   . Smokeless tobacco: None  . Alcohol Use: Yes     Comment: social   OB History    No data available     Review of Systems  Constitutional: Negative for fever and weight loss.  Cardiovascular: Negative for chest pain.  Gastrointestinal: Negative for abdominal pain and bowel incontinence.  Genitourinary: Negative for bladder incontinence, dysuria, flank pain and pelvic pain.  Musculoskeletal: Positive for myalgias and back pain.  Neurological: Negative for tingling, weakness, numbness, headaches and paresthesias.  All other systems reviewed and are negative.  Allergies  Review of patient's allergies indicates no known allergies.  Home Medications   Prior to Admission medications   Medication Sig Start Date End Date Taking? Authorizing Provider  albuterol (PROVENTIL HFA;VENTOLIN HFA) 108 (90 BASE) MCG/ACT inhaler Inhale 2 puffs into the lungs every 6 (six)  hours as needed for wheezing or shortness of breath. 09/19/14   Kermit Balo Tysinger, PA-C  albuterol (PROVENTIL) (2.5 MG/3ML) 0.083% nebulizer solution Take 2.5 mg by nebulization every 6 (six) hours as needed for wheezing or shortness of breath.    Historical Provider, MD  amoxicillin (AMOXIL) 500 MG tablet Take 2 tablets (1,000 mg total) by mouth 2 (two) times daily. 10/11/14   Elwin Mocha, MD  amoxicillin-clavulanate (AUGMENTIN) 875-125 MG per tablet Take 1 tablet by mouth 2 (two) times daily.    Historical Provider, MD  benzonatate (TESSALON) 200 MG capsule Take 1 capsule (200 mg total) by  mouth 3 (three) times daily as needed for cough. 09/19/14   Kermit Balo Tysinger, PA-C  citalopram (CELEXA) 40 MG tablet 1/2 tablet QHS x 1wk, then 1 tablet po QHS Patient not taking: Reported on 09/01/2014 01/17/14   Kermit Balo Tysinger, PA-C  HYDROcodone-acetaminophen (NORCO/VICODIN) 5-325 MG per tablet Take 1 tablet by mouth every 6 (six) hours as needed for moderate pain. 10/11/14   Elwin Mocha, MD  methylPREDNISolone (MEDROL DOSEPAK) 4 MG tablet Take by mouth. follow package directions    Historical Provider, MD   BP 130/84 mmHg  Pulse 110  Temp(Src) 98.6 F (37 C) (Oral)  Resp 22  Ht 5\' 5"  (1.651 m)  Wt 268 lb (121.564 kg)  BMI 44.60 kg/m2  SpO2 100%  LMP 02/15/2015 Physical Exam  Constitutional: She is oriented to person, place, and time. She appears well-developed.  HENT:  Head: Normocephalic and atraumatic.  Mouth/Throat: Oropharynx is clear and moist. No oropharyngeal exudate.  Eyes: EOM are normal. Pupils are equal, round, and reactive to light.  Neck: Normal range of motion. Neck supple. No tracheal deviation present.  Cardiovascular: Normal rate, regular rhythm and normal heart sounds.  Exam reveals no gallop and no friction rub.   No murmur heard. Pulmonary/Chest: Effort normal and breath sounds normal. No respiratory distress. She has no wheezes. She has no rales. She exhibits no tenderness.  Abdominal: Soft. Bowel sounds are normal. She exhibits no mass. There is no tenderness. There is no rebound and no guarding.  Stool present in transverse colon  Musculoskeletal: Normal range of motion. She exhibits no edema or tenderness.  paraspinal spasm S2; pelvis stable.  No tep off nor crepitance nor point tenderness of the c t or l spine  Neurological: She is alert and oriented to person, place, and time. She has normal reflexes.  Achilles, L5 and S1 intact;   Skin: Skin is warm and dry.  Psychiatric: She has a normal mood and affect.  Nursing note and vitals reviewed.  ED Course   Procedures  DIAGNOSTIC STUDIES: Oxygen Saturation is 100% on RA, normal by my interpretation.    COORDINATION OF CARE: 11:42 PM Discussed treatment plan with pt at bedside and pt agreed to plan.  Labs Review Labs Reviewed - No data to display  Imaging Review No results found. I, Latoria Dry, MD, personally reviewed and evaluated these images and lab results as part of my medical decision-making.   EKG Interpretation None      MDM   Final diagnoses:  None    Medications  ketorolac (TORADOL) injection 60 mg (60 mg Intramuscular Given 03/11/15 2353)  dexamethasone (DECADRON) injection 10 mg (10 mg Intramuscular Given 03/11/15 2353)  methocarbamol (ROBAXIN) tablet 1,000 mg (1,000 mg Oral Given 03/11/15 2353)     Medication List    TAKE these medications        methocarbamol 500  MG tablet  Commonly known as:  ROBAXIN  Take 1 tablet (500 mg total) by mouth 2 (two) times daily.     naproxen 500 MG tablet  Commonly known as:  NAPROSYN  Take 1 tablet (500 mg total) by mouth 2 (two) times daily.     predniSONE 20 MG tablet  Commonly known as:  DELTASONE  3 tabs po day one, then 2 po daily x 4 days     traMADol 50 MG tablet  Commonly known as:  ULTRAM  Take 1 tablet (50 mg total) by mouth every 6 (six) hours as needed.      ASK your doctor about these medications        albuterol (2.5 MG/3ML) 0.083% nebulizer solution  Commonly known as:  PROVENTIL  Take 2.5 mg by nebulization every 6 (six) hours as needed for wheezing or shortness of breath.     albuterol 108 (90 BASE) MCG/ACT inhaler  Commonly known as:  PROVENTIL HFA;VENTOLIN HFA  Inhale 2 puffs into the lungs every 6 (six) hours as needed for wheezing or shortness of breath.     amoxicillin 500 MG tablet  Commonly known as:  AMOXIL  Take 2 tablets (1,000 mg total) by mouth 2 (two) times daily.     amoxicillin-clavulanate 875-125 MG per tablet  Commonly known as:  AUGMENTIN  Take 1 tablet by mouth 2 (two)  times daily.     benzonatate 200 MG capsule  Commonly known as:  TESSALON  Take 1 capsule (200 mg total) by mouth 3 (three) times daily as needed for cough.     citalopram 40 MG tablet  Commonly known as:  CELEXA  1/2 tablet QHS x 1wk, then 1 tablet po QHS     HYDROcodone-acetaminophen 5-325 MG per tablet  Commonly known as:  NORCO/VICODIN  Take 1 tablet by mouth every 6 (six) hours as needed for moderate pain.     methylPREDNISolone 4 MG tablet  Commonly known as:  MEDROL DOSEPAK  Take by mouth. follow package directions       Heat continued movement no heavy lifting.  Close follow up with your PMD.  Strict return precautions given  I personally performed the services described in this documentation, which was scribed in my presence. The recorded information has been reviewed and is accurate.     Cy Blamer, MD 03/12/15 [phone]

## 2015-03-11 NOTE — ED Notes (Signed)
Alert, NAD, calm, interactive, looks uncomfortable, sitting in chair, family at [facility], updated on process and wait.

## 2015-03-12 ENCOUNTER — Encounter (HOSPITAL_BASED_OUTPATIENT_CLINIC_OR_DEPARTMENT_OTHER): Payer: Self-pay | Admitting: Emergency Medicine

## 2015-03-12 MED ORDER — TRAMADOL HCL 50 MG PO TABS: 50.0000 mg | ORAL_TABLET | Freq: Four times a day (QID) | ORAL | Status: DC | PRN

## 2015-03-12 MED ORDER — NAPROXEN 500 MG PO TABS: 500.0000 mg | ORAL_TABLET | Freq: Two times a day (BID) | ORAL | Status: DC

## 2015-03-12 MED ORDER — METHOCARBAMOL 500 MG PO TABS: 500.0000 mg | ORAL_TABLET | Freq: Two times a day (BID) | ORAL | Status: DC

## 2015-03-12 MED ORDER — PREDNISONE 20 MG PO TABS: ORAL_TABLET | ORAL | Status: DC

## 2015-03-12 NOTE — Discharge Instructions (Signed)

## 2016-10-11 ENCOUNTER — Encounter (HOSPITAL_BASED_OUTPATIENT_CLINIC_OR_DEPARTMENT_OTHER): Payer: Self-pay | Admitting: *Deleted

## 2016-10-11 ENCOUNTER — Emergency Department (HOSPITAL_BASED_OUTPATIENT_CLINIC_OR_DEPARTMENT_OTHER): Payer: 59

## 2016-10-11 ENCOUNTER — Emergency Department (HOSPITAL_BASED_OUTPATIENT_CLINIC_OR_DEPARTMENT_OTHER)
Admission: EM | Admit: 2016-10-11 | Discharge: 2016-10-11 | Disposition: A | Discharge status: Home / Self Care | Payer: 59 | Attending: Emergency Medicine | Admitting: Emergency Medicine

## 2016-10-11 DIAGNOSIS — Z79899 Other long term (current) drug therapy: Secondary | ICD-10-CM | POA: Diagnosis not present

## 2016-10-11 DIAGNOSIS — R52 Pain, unspecified: Secondary | ICD-10-CM

## 2016-10-11 DIAGNOSIS — M25552 Pain in left hip: Secondary | ICD-10-CM | POA: Diagnosis not present

## 2016-10-11 LAB — PREGNANCY, URINE: Preg Test, Ur: NEGATIVE

## 2016-10-11 MED ORDER — NAPROXEN 500 MG PO TABS: 500.0000 mg | ORAL_TABLET | Freq: Two times a day (BID) | ORAL | 0 refills | Status: DC

## 2016-10-11 MED ORDER — METHOCARBAMOL 500 MG PO TABS: 500.0000 mg | ORAL_TABLET | Freq: Every evening | ORAL | 0 refills | Status: DC | PRN

## 2016-10-11 MED ORDER — NAPROXEN 250 MG PO TABS
500.0000 mg | ORAL_TABLET | Freq: Once | ORAL | Status: AC
  Administered 2016-10-11: 500 mg via ORAL
  Filled 2016-10-11: qty 2

## 2016-10-11 MED FILL — NAPROXEN 500 MG TABLET: 500 | 15 days supply | Qty: 30 | Fill #0

## 2016-10-11 MED FILL — METHOCARBAMOL 500 MG TABLET: 500 | 20 days supply | Qty: 20 | Fill #0

## 2016-10-11 NOTE — ED Triage Notes (Signed)
Pt states she fell on her steps about one month ago and landed on left hip. States she didn't have any issues until 2 weeks ago when she began having hip pain. States sx are not improving. States she doesn't like taking medicines so has not being taking anything for pain except a goody powder one time

## 2016-10-11 NOTE — ED Provider Notes (Signed)
[facility]   CSN: [id number] Arrival date & time: 10/11/16  1025     History   Chief Complaint Chief Complaint  Patient presents with  . Hip Pain    HPI Amy Matthews is a 36 y.o. female presenting with 2 weeks of sharp nonradiating left hip pain. She reports a fall 1 month ago down 12 steps in which she hit her left hip and noticed bruising. She did not have any problems afterwards of pain for the next 2 weeks. The pain is worse with sitting for prolonged periods of time and especially driving with the seatbelt. She does not like to take medications or has not taken anything for pain except a Goody powder today. The pain is somewhat relieved by leaning forward or adjusting her position. She denies any trouble walking, numbness, tingling, loss of consciousness or other trauma.  HPI  Past Medical History:  Diagnosis Date  . HRP (high risk pregnancy)    prolonged hospitalization, cerclage done  . IUD (intrauterine device) in place 2006   Mirena  . Obesity   . Wears contact lenses     There are no active problems to display for this patient.   Past Surgical History:  Procedure Laterality Date  . CERVICAL CERCLAGE    . CYST EXCISION  2004   under right breast  . DILATION AND CURETTAGE OF UTERUS  2010   due to miscarriage    OB History    No data available       Home Medications    Prior to Admission medications   Medication Sig Start Date End Date Taking? Authorizing Provider  albuterol (PROVENTIL HFA;VENTOLIN HFA) 108 (90 BASE) MCG/ACT inhaler Inhale 2 puffs into the lungs every 6 (six) hours as needed for wheezing or shortness of breath. 09/19/14   Kermit Baloavid S Tysinger, PA-C  albuterol (PROVENTIL) (2.5 MG/3ML) 0.083% nebulizer solution Take 2.5 mg by nebulization every 6 (six) hours as needed for wheezing or shortness of breath.    Historical Provider, MD  amoxicillin (AMOXIL) 500 MG tablet Take 2 tablets (1,000 mg total) by mouth 2 (two)  times daily. 10/11/14   Elwin MochaBlair Walden, MD  amoxicillin-clavulanate (AUGMENTIN) 875-125 MG per tablet Take 1 tablet by mouth 2 (two) times daily.    Historical Provider, MD  benzonatate (TESSALON) 200 MG capsule Take 1 capsule (200 mg total) by mouth 3 (three) times daily as needed for cough. 09/19/14   Kermit Baloavid S Tysinger, PA-C  citalopram (CELEXA) 40 MG tablet 1/2 tablet QHS x 1wk, then 1 tablet po QHS Patient not taking: Reported on 09/01/2014 01/17/14   Kermit Baloavid S Tysinger, PA-C  HYDROcodone-acetaminophen (NORCO/VICODIN) 5-325 MG per tablet Take 1 tablet by mouth every 6 (six) hours as needed for moderate pain. 10/11/14   Elwin MochaBlair Walden, MD  methocarbamol (ROBAXIN) 500 MG tablet Take 1 tablet (500 mg total) by mouth at bedtime as needed for muscle spasms. 10/11/16   Georgiana ShoreJessica B Mirian Casco, PA-C  methylPREDNISolone (MEDROL DOSEPAK) 4 MG tablet Take by mouth. follow package directions    Historical Provider, MD  naproxen (NAPROSYN) 500 MG tablet Take 1 tablet (500 mg total) by mouth 2 (two) times daily with a meal. 10/11/16   Georgiana ShoreJessica B Teshawn Moan, PA-C  predniSONE (DELTASONE) 20 MG tablet 3 tabs po day one, then 2 po daily x 4 days 03/12/15   April Palumbo, MD  traMADol (ULTRAM) 50 MG tablet Take 1 tablet (50 mg total) by mouth every 6 (six) hours as  needed. 03/12/15   April Palumbo, MD    Family History Family History  Problem Relation Age of Onset  . Kidney disease Mother     dialysis  . Diabetes Mother   . Ulcers Father   . Heart disease Maternal Grandmother   . Other Maternal Grandfather     old age  . Dementia Paternal Grandmother   . Cancer Other     breast  . Hypertension Neg Hx   . Hyperlipidemia Neg Hx   . Stroke Neg Hx     Social History Social History  Substance Use Topics  . Smoking status: Never Smoker  . Smokeless tobacco: Never Used  . Alcohol use Yes     Comment: social     Allergies   Patient has no known allergies.   Review of Systems Review of Systems  Constitutional:  Negative for chills and fever.  HENT: Negative for ear pain and sore throat.   Eyes: Negative for pain and visual disturbance.  Respiratory: Negative for cough, chest tightness and shortness of breath.   Cardiovascular: Negative for chest pain, palpitations and leg swelling.  Gastrointestinal: Negative for abdominal distention, abdominal pain and vomiting.  Genitourinary: Negative for dysuria, flank pain and hematuria.  Musculoskeletal: Positive for arthralgias. Negative for back pain, gait problem, joint swelling, myalgias, neck pain and neck stiffness.       Patient reports deep left hip pain focal nonradiating. No leg swelling or gait problems.  Skin: Negative for color change, pallor and rash.  Neurological: Negative for dizziness, seizures, syncope, weakness and numbness.  All other systems reviewed and are negative.    Physical Exam Updated Vital Signs BP (!) 164/101 (BP Location: Left Arm)   Pulse (!) 102   Temp 98.4 F (36.9 C) (Oral)   Resp 18   Ht 5' 5.5" (1.664 m)   Wt 106.6 kg   LMP 09/15/2016   SpO2 100%   BMI 38.51 kg/m   Physical Exam  Constitutional: She is oriented to person, place, and time. She appears well-developed and well-nourished. No distress.  Patient is afebrile, nontoxic-appearing, sitting with left leg extended off the bed to find comfort. No acute distress  HENT:  Head: Normocephalic and atraumatic.  Eyes: Conjunctivae are normal.  Neck: Neck supple.  Cardiovascular: Normal rate, regular rhythm, normal heart sounds and intact distal pulses.   No murmur heard. Pulmonary/Chest: Effort normal and breath sounds normal. No respiratory distress. She has no wheezes. She has no rales. She exhibits no tenderness.  Abdominal: She exhibits no distension.  Musculoskeletal: Normal range of motion. She exhibits no edema, tenderness or deformity.  Patient with full range of motion of the hip. Pain with passive external rotation of the left hip and flexion of  the left hip. No tenderness palpation at the hip. No swelling or ecchymosis.  Neurological: She is alert and oriented to person, place, and time. No sensory deficit. She exhibits normal muscle tone.  Neurovascularly intact bilaterally  Skin: Skin is warm and dry. She is not diaphoretic. No erythema. No pallor.  Psychiatric: She has a normal mood and affect. Her behavior is normal.  Nursing note and vitals reviewed.    ED Treatments / Results  Labs (all labs ordered are listed, but only abnormal results are displayed) Labs Reviewed  PREGNANCY, URINE    EKG  EKG Interpretation None       Radiology Dg Hip Unilat With Pelvis Min 4 Views Left  Result Date: 10/11/2016 CLINICAL DATA:  Left lateral hip pain for the past 2 weeks. Fell 1 month ago. EXAM: DG HIP (WITH OR WITHOUT PELVIS) 4+V LEFT COMPARISON:  None. FINDINGS: There is no evidence of hip fracture or dislocation. There is no evidence of arthropathy or other focal bone abnormality. IMPRESSION: Normal examination. Electronically Signed   By: Beckie Salts M.D.   On: 10/11/2016 12:32    Procedures Procedures (including critical care time)  Medications Ordered in ED Medications  naproxen (NAPROSYN) tablet 500 mg (500 mg Oral Given 10/11/16 1154)     Initial Impression / Assessment and Plan / ED Course  I have reviewed the triage vital signs and the nursing notes.  Pertinent labs & imaging results that were available during my care of the patient were reviewed by me and considered in my medical decision making (see chart for details).     Patient presents after a fall 1 month ago where she did not think she had any injuries for the first 2 weeks and began to have sharp nonradiating left hip pain worse with sitting for prolonged periods of times especially driving.  On exam she has pain with flexion and external rotation of the hip. Patient with normal gait and stance. Neurovascularly intact bilaterally in lower extremities.  No other complaints. X-ray negative for fracture Patient was given naproxen and ice with improvement while in ED.  Discharge home with symptomatic relief, rice protocol, and follow-up with orthopedics.  Discussed strict return precautions. Patient was advised to return to the emergency department if experiencing any new or worsening symptoms. Patient clearly understood instructions and agreed with discharge plan.  Final Clinical Impressions(s) / ED Diagnoses   Final diagnoses:  Left hip pain    New Prescriptions New Prescriptions   METHOCARBAMOL (ROBAXIN) 500 MG TABLET    Take 1 tablet (500 mg total) by mouth at bedtime as needed for muscle spasms.   NAPROXEN (NAPROSYN) 500 MG TABLET    Take 1 tablet (500 mg total) by mouth 2 (two) times daily with a meal.     Georgiana Shore, PA-C 10/11/16 1336    Geoffery Lyons, MD 10/11/16 [phone]

## 2016-10-11 NOTE — Discharge Instructions (Signed)
As discussed, you may use ice, naproxen and muscle relaxer for comfort. Follow-up with orthopedic.  Return to the emergency department if you experience numbness or difficulty walking or bearing weight or any other concerning symptoms in the meantime.

## 2016-12-02 ENCOUNTER — Encounter (HOSPITAL_BASED_OUTPATIENT_CLINIC_OR_DEPARTMENT_OTHER): Payer: Self-pay | Admitting: Emergency Medicine

## 2016-12-02 ENCOUNTER — Emergency Department (HOSPITAL_BASED_OUTPATIENT_CLINIC_OR_DEPARTMENT_OTHER)
Admission: EM | Admit: 2016-12-02 | Discharge: 2016-12-02 | Disposition: A | Discharge status: Home / Self Care | Payer: 59 | Attending: Emergency Medicine | Admitting: Emergency Medicine

## 2016-12-02 DIAGNOSIS — L0231 Cutaneous abscess of buttock: Secondary | ICD-10-CM | POA: Diagnosis not present

## 2016-12-02 DIAGNOSIS — L0291 Cutaneous abscess, unspecified: Secondary | ICD-10-CM

## 2016-12-02 MED ORDER — CEPHALEXIN 500 MG PO CAPS: 500.0000 mg | ORAL_CAPSULE | Freq: Three times a day (TID) | ORAL | 0 refills | Status: DC

## 2016-12-02 MED ORDER — HYDROCODONE-ACETAMINOPHEN 5-325 MG PO TABS: 1.0000 | ORAL_TABLET | Freq: Four times a day (QID) | ORAL | 0 refills | Status: DC | PRN

## 2016-12-02 MED ORDER — LIDOCAINE-EPINEPHRINE (PF) 2 %-1:200000 IJ SOLN
INTRAMUSCULAR | Status: AC
  Administered 2016-12-02: 11:00:00
  Filled 2016-12-02: qty 10

## 2016-12-02 MED FILL — HYDROCODON-APAP 5-325: 5-325 | 2 days supply | Qty: 12 | Fill #0

## 2016-12-02 MED FILL — CEPHALEXIN 500 MG CAPSULE: 500 | 7 days supply | Qty: 21 | Fill #0

## 2016-12-02 NOTE — Discharge Instructions (Signed)
Sit in bathtub 4 times daily for 30 minutes at a time or let the warm water from shower hit the wound on your left buttock. Take Tylenol for mild pain or the pain medicine prescribed for bad pain. Don't take Tylenol together with the pain medicine prescribed as the combination can be dangerous to your liver. Take the antibiotic as prescribed. You should get the abscess rechecked in 3 or 4 days by her primary care physician. Your blood pressure should be rechecked within the next 3 weeks. Today's was elevated at 143/106. Return for fever, vomiting, or if you feel worse for any reason

## 2016-12-02 NOTE — ED Notes (Signed)
Patient has a noted abscess to her left buttocks region. Has drained already. Patient set up for further I and D

## 2016-12-02 NOTE — ED Provider Notes (Signed)
[facility] Provider Note   CSN: [id number] Arrival date & time: 12/02/16  0950     History   Chief Complaint Chief Complaint  Patient presents with  . Abscess    HPI Amy Matthews is a 36 y.o. female.Complains of painful swollen area over left buttock which started 3 days ago. Pain lessen this morning when the area "popped" and drainage ran down her leg while she was standing in the shower. No other associated symptoms no pain with bowel movements no fever. She did feel chills this morning which has resolved. No other associated symptoms. No treatment prior to coming here. Pain is worse with pressing on the area and improved spontaneously after the area "popped" and released fluid  HPI  Past Medical History:  Diagnosis Date  . HRP (high risk pregnancy)    prolonged hospitalization, cerclage done  . IUD (intrauterine device) in place 2006   Mirena  . Obesity   . Wears contact lenses     There are no active problems to display for this patient.   Past Surgical History:  Procedure Laterality Date  . CERVICAL CERCLAGE    . CYST EXCISION  2004   under right breast  . DILATION AND CURETTAGE OF UTERUS  2010   due to miscarriage    OB History    No data available       Home Medications    Prior to Admission medications   Medication Sig Start Date End Date Taking? Authorizing Provider  albuterol (PROVENTIL HFA;VENTOLIN HFA) 108 (90 BASE) MCG/ACT inhaler Inhale 2 puffs into the lungs every 6 (six) hours as needed for wheezing or shortness of breath. 09/19/14   Tysinger, Kermit Balo, PA-C  albuterol (PROVENTIL) (2.5 MG/3ML) 0.083% nebulizer solution Take 2.5 mg by nebulization every 6 (six) hours as needed for wheezing or shortness of breath.    [provider]  amoxicillin (AMOXIL) 500 MG tablet Take 2 tablets (1,000 mg total) by mouth 2 (two) times daily. 10/11/14   Elwin Mocha, MD  amoxicillin-clavulanate (AUGMENTIN) 875-125 MG per tablet Take 1  tablet by mouth 2 (two) times daily.    [provider]  benzonatate (TESSALON) 200 MG capsule Take 1 capsule (200 mg total) by mouth 3 (three) times daily as needed for cough. 09/19/14   Tysinger, Kermit Balo, PA-C  citalopram (CELEXA) 40 MG tablet 1/2 tablet QHS x 1wk, then 1 tablet po QHS Patient not taking: Reported on 09/01/2014 01/17/14   Tysinger, Kermit Balo, PA-C  HYDROcodone-acetaminophen (NORCO/VICODIN) 5-325 MG per tablet Take 1 tablet by mouth every 6 (six) hours as needed for moderate pain. 10/11/14   Elwin Mocha, MD  methocarbamol (ROBAXIN) 500 MG tablet Take 1 tablet (500 mg total) by mouth at bedtime as needed for muscle spasms. 10/11/16   Mathews Robinsons B, PA-C  methylPREDNISolone (MEDROL DOSEPAK) 4 MG tablet Take by mouth. follow package directions    [provider]  naproxen (NAPROSYN) 500 MG tablet Take 1 tablet (500 mg total) by mouth 2 (two) times daily with a meal. 10/11/16   Mathews Robinsons B, PA-C  predniSONE (DELTASONE) 20 MG tablet 3 tabs po day one, then 2 po daily x 4 days 03/12/15   Palumbo, April, MD  traMADol (ULTRAM) 50 MG tablet Take 1 tablet (50 mg total) by mouth every 6 (six) hours as needed. 03/12/15   Palumbo, April, MD    Family History Family History  Problem Relation Age of Onset  . Kidney disease Mother  dialysis  . Diabetes Mother   . Ulcers Father   . Heart disease Maternal Grandmother   . Other Maternal Grandfather     old age  . Dementia Paternal Grandmother   . Cancer Other     breast  . Hypertension Neg Hx   . Hyperlipidemia Neg Hx   . Stroke Neg Hx     Social History Social History  Substance Use Topics  . Smoking status: Never Smoker  . Smokeless tobacco: Never Used  . Alcohol use Yes     Comment: social     Allergies   Patient has no known allergies.   Review of Systems Review of Systems  Constitutional: Positive for chills.  HENT: Negative.   Respiratory: Negative.   Cardiovascular: Negative.     Gastrointestinal: Negative.   Musculoskeletal: Negative.   Skin: Positive for wound.       Swollen painful area and left buttock  Neurological: Negative.   Psychiatric/Behavioral: Negative.   All other systems reviewed and are negative.    Physical Exam Updated Vital Signs BP (!) 160/95   Pulse (!) 121   Temp 98.7 F (37.1 C) (Oral)   Resp 18   Ht 5\' 5"  (1.651 m)   Wt 230 lb (104.3 kg)   SpO2 100%   BMI 38.27 kg/m   Physical Exam  Constitutional: She appears well-developed and well-nourished.  HENT:  Head: Normocephalic and atraumatic.  Eyes: Conjunctivae are normal. Pupils are equal, round, and reactive to light.  Neck: Neck supple. No tracheal deviation present. No thyromegaly present.  Cardiovascular: Normal rate and regular rhythm.   No murmur heard. Pulmonary/Chest: Effort normal and breath sounds normal.  Abdominal: Soft. Bowel sounds are normal. She exhibits no distension. There is no tenderness.  Obese  Musculoskeletal: Normal range of motion. She exhibits no edema or tenderness.  Neurological: She is alert. Coordination normal.  Skin: Skin is warm and dry. No rash noted.  Golf ball sized fluctuant tender area at left buttock medial aspect not involving perirectal area. Draining a slight amount of pus, spontaneously  Psychiatric: She has a normal mood and affect.  Nursing note and vitals reviewed.    ED Treatments / Results  Labs (all labs ordered are listed, but only abnormal results are displayed) Labs Reviewed - No data to display  EKG  EKG Interpretation None       Radiology No results found.  Procedures .Marland Kitchen.Incision and Drainage Date/Time: 12/02/2016 11:14 AM Performed by: Doug SouJACUBOWITZ, Zawadi Aplin Authorized by: Doug SouJACUBOWITZ, Claudetta Sallie   Consent:    Consent obtained:  Verbal   Consent given by:  Patient   Risks discussed:  Bleeding, incomplete drainage and pain   Alternatives discussed:  No treatment and delayed treatment Location:    Type:  Abscess    Size:  Golf ball sized. Left buttock Pre-procedure details:    Skin preparation:  Betadine Anesthesia (see MAR for exact dosages):    Anesthesia method:  Local infiltration   Local anesthetic:  Lidocaine 2% WITH epi Procedure type:    Complexity:  Complex Procedure details:    Needle aspiration: no     Incision types:  Single straight   Incision depth:  Subcutaneous   Scalpel blade:  11   Wound management:  Probed and deloculated and irrigated with saline   Drainage:  Purulent   Drainage amount:  Moderate   Packing materials:  None Post-procedure details:    Patient tolerance of procedure:  Tolerated well, no immediate complications   (  including critical care time)  Medications Ordered in ED Medications  lidocaine-EPINEPHrine (XYLOCAINE W/EPI) 2 %-1:200000 (PF) injection (not administered)     Initial Impression / Assessment and Plan / ED Course  I have reviewed the triage vital signs and the nursing notes.  Pertinent labs & imaging results that were available during my care of the patient were reviewed by me and considered in my medical decision making (see chart for details).    Plan warm soaks. Prescriptions Norco, Keflex. [place] Controlled Substance reporting System queried   Wound check 3 days and blood pressure recheck within the next 3 weeks.  Final Clinical Impressions(s) / ED Diagnoses  Diagnoses #1 abscess of left buttock #2 elevated blood pressure Final diagnoses:  None    New Prescriptions New Prescriptions   No medications on file     Doug Sou, MD 12/02/16 1125

## 2017-06-08 ENCOUNTER — Encounter (HOSPITAL_BASED_OUTPATIENT_CLINIC_OR_DEPARTMENT_OTHER): Payer: Self-pay | Admitting: *Deleted

## 2017-06-08 ENCOUNTER — Other Ambulatory Visit: Payer: Self-pay

## 2017-06-08 ENCOUNTER — Emergency Department (HOSPITAL_BASED_OUTPATIENT_CLINIC_OR_DEPARTMENT_OTHER)
Admission: EM | Admit: 2017-06-08 | Discharge: 2017-06-08 | Disposition: A | Discharge status: Home / Self Care | Payer: 59 | Attending: Emergency Medicine | Admitting: Emergency Medicine

## 2017-06-08 DIAGNOSIS — H5711 Ocular pain, right eye: Secondary | ICD-10-CM | POA: Diagnosis present

## 2017-06-08 DIAGNOSIS — H1031 Unspecified acute conjunctivitis, right eye: Secondary | ICD-10-CM | POA: Insufficient documentation

## 2017-06-08 DIAGNOSIS — Z79899 Other long term (current) drug therapy: Secondary | ICD-10-CM | POA: Diagnosis not present

## 2017-06-08 MED ORDER — ERYTHROMYCIN 5 MG/GM OP OINT
TOPICAL_OINTMENT | Freq: Four times a day (QID) | OPHTHALMIC | Status: DC
  Administered 2017-06-08: 1 via OPHTHALMIC

## 2017-06-08 MED ORDER — FLUORESCEIN SODIUM 0.6 MG OP STRP
ORAL_STRIP | OPHTHALMIC | Status: AC
  Filled 2017-06-08: qty 1

## 2017-06-08 MED ORDER — IBUPROFEN 800 MG PO TABS: 800.0000 mg | ORAL_TABLET | Freq: Three times a day (TID) | ORAL | 0 refills | Status: DC

## 2017-06-08 MED ORDER — ERYTHROMYCIN 5 MG/GM OP OINT
TOPICAL_OINTMENT | OPHTHALMIC | Status: AC
  Filled 2017-06-08: qty 3.5

## 2017-06-08 MED ORDER — TETRACAINE HCL 0.5 % OP SOLN
OPHTHALMIC | Status: AC
  Filled 2017-06-08: qty 4

## 2017-06-08 NOTE — ED Notes (Signed)
ED Provider at bedside. 

## 2017-06-08 NOTE — ED Provider Notes (Signed)
[facility] EMERGENCY DEPARTMENT Provider Note   CSN: [id number] Arrival date & time: 06/08/17  [id number]     History   Chief Complaint Chief Complaint  Patient presents with  . Eye Pain    HPI Amy Matthews is a 36 y.o. female.  HPI  36 year old female, she has a known history of contact lens wearing for her visual acuity, she reports that she wears 30-day contact lenses and last night took them out, she took them out early, she denies having any trouble with them and felt good when she went to bed however when she woke this morning she noticed that she had right-sided eye pain with a red eye, excessive tearing and photophobia.  She denies injuring her eye but thinks that she may have scratched the cornea last night when taking them out.  She is not sure and does not recall a specific injury.  She denies any blurry vision from that eye.  Symptoms are persistent and severe.  Past Medical History:  Diagnosis Date  . HRP (high risk pregnancy)    prolonged hospitalization, cerclage done  . IUD (intrauterine device) in place 2006   Mirena  . Obesity   . Wears contact lenses     There are no active problems to display for this patient.   Past Surgical History:  Procedure Laterality Date  . CERVICAL CERCLAGE    . CYST EXCISION  2004   under right breast  . DILATION AND CURETTAGE OF UTERUS  2010   due to miscarriage    OB History    No data available       Home Medications    Prior to Admission medications   Medication Sig Start Date End Date Taking? Authorizing Provider  albuterol (PROVENTIL HFA;VENTOLIN HFA) 108 (90 BASE) MCG/ACT inhaler Inhale 2 puffs into the lungs every 6 (six) hours as needed for wheezing or shortness of breath. 09/19/14   Tysinger, Kermit Baloavid S, PA-C  albuterol (PROVENTIL) (2.5 MG/3ML) 0.083% nebulizer solution Take 2.5 mg by nebulization every 6 (six) hours as needed for wheezing or shortness of breath.    [provider]    amoxicillin (AMOXIL) 500 MG tablet Take 2 tablets (1,000 mg total) by mouth 2 (two) times daily. 10/11/14   Elwin MochaWalden, Blair, MD  amoxicillin-clavulanate (AUGMENTIN) 875-125 MG per tablet Take 1 tablet by mouth 2 (two) times daily.    [provider]  benzonatate (TESSALON) 200 MG capsule Take 1 capsule (200 mg total) by mouth 3 (three) times daily as needed for cough. 09/19/14   Tysinger, Kermit Baloavid S, PA-C  cephALEXin (KEFLEX) 500 MG capsule Take 1 capsule (500 mg total) by mouth 3 (three) times daily. 12/02/16   Doug SouJacubowitz, Sam, MD  citalopram (CELEXA) 40 MG tablet 1/2 tablet QHS x 1wk, then 1 tablet po QHS Patient not taking: Reported on 09/01/2014 01/17/14   Tysinger, Kermit Baloavid S, PA-C  HYDROcodone-acetaminophen (NORCO) 5-325 MG tablet Take 1-2 tablets by mouth every 6 (six) hours as needed for severe pain. 12/02/16   Doug SouJacubowitz, Sam, MD  ibuprofen (ADVIL,MOTRIN) 800 MG tablet Take 1 tablet (800 mg total) 3 (three) times daily by mouth. 06/08/17   Eber HongMiller, Ashby Leflore, MD  methocarbamol (ROBAXIN) 500 MG tablet Take 1 tablet (500 mg total) by mouth at bedtime as needed for muscle spasms. 10/11/16   Mathews RobinsonsMitchell, Jessica B, PA-C  methylPREDNISolone (MEDROL DOSEPAK) 4 MG tablet Take by mouth. follow package directions    [provider]  naproxen (NAPROSYN) 500 MG tablet  Take 1 tablet (500 mg total) by mouth 2 (two) times daily with a meal. 10/11/16   Mathews Robinsons B, PA-C  predniSONE (DELTASONE) 20 MG tablet 3 tabs po day one, then 2 po daily x 4 days 03/12/15   Palumbo, April, MD  traMADol (ULTRAM) 50 MG tablet Take 1 tablet (50 mg total) by mouth every 6 (six) hours as needed. 03/12/15   Palumbo, April, MD    Family History Family History  Problem Relation Age of Onset  . Kidney disease Mother        dialysis  . Diabetes Mother   . Ulcers Father   . Heart disease Maternal Grandmother   . Other Maternal Grandfather        old age  . Dementia Paternal Grandmother   . Cancer Other        breast  .  Hypertension Neg Hx   . Hyperlipidemia Neg Hx   . Stroke Neg Hx     Social History Social History   Tobacco Use  . Smoking status: Never Smoker  . Smokeless tobacco: Never Used  Substance Use Topics  . Alcohol use: Yes    Comment: social  . Drug use: No     Allergies   Patient has no known allergies.   Review of Systems Review of Systems  Constitutional: Negative for fever.  Eyes: Positive for photophobia, pain, discharge and redness. Negative for visual disturbance.  Neurological: Negative for headaches.     Physical Exam Updated Vital Signs BP (!) 156/98 (BP Location: Right Arm)   Pulse 82   Temp 98.4 F (36.9 C) (Oral)   Resp 18   Ht 5\' 5"  (1.651 m)   Wt 106.6 kg (235 lb)   LMP 06/02/2017   SpO2 100%   BMI 39.11 kg/m   Physical Exam  Constitutional: She appears well-developed and well-nourished.  HENT:  Head: Normocephalic and atraumatic.  Eyes: Right eye exhibits no discharge. Left eye exhibits no discharge.  Pupils are equal round reactive to light, extraocular movements are normal, the conjunctivae are normal on the left but diffusely injected on the right.  There is no peri-limbic flush, there is no significant consensual pain, tetracaine completely resolves the pain in the right eye.  On fluorescein exam with the slit lamp there is no signs of floor seen uptake.  There is no anterior chamber cells and flare.  The intraocular pressure is 19 as measured 3 times in the right eye.  Lid eversion with no foreign bodies.  Pulmonary/Chest: Effort normal. No respiratory distress.  Neurological: She is alert. Coordination normal.  Skin: Skin is warm and dry. No rash noted. She is not diaphoretic. No erythema.  Psychiatric: She has a normal mood and affect.  Nursing note and vitals reviewed.    ED Treatments / Results  Labs (all labs ordered are listed, but only abnormal results are displayed) Labs Reviewed - No data to display   Radiology No results  found.  Procedures Procedures (including critical care time)  Medications Ordered in ED Medications  fluorescein 0.6 MG ophthalmic strip (not administered)  tetracaine (PONTOCAINE) 0.5 % ophthalmic solution (not administered)  erythromycin 5 MG/GM ophthalmic ointment (not administered)  erythromycin ophthalmic ointment (not administered)     Initial Impression / Assessment and Plan / ED Course  I have reviewed the triage vital signs and the nursing notes.  Pertinent labs & imaging results that were available during my care of the patient were reviewed by me and  considered in my medical decision making (see chart for details).    There appears to be diffuse conjunctival injection, this is what appears to be an acute conjunctivitis however it does not appear to be bacterial in nature.  She may have had some issue with contact solution or injury to the sclera or cornea however there is no specific fluorescein uptake.  Given her use of 30-day contact lenses and now having unilateral eye pain with a normal intraocular pressure and no consensual pain with tetracaine totally resolving her symptoms I would suspect that she has some type of corneal process.  Erythromycin ointment given as a precautionary, the patient will follow up with her optometrist tomorrow.  She has been cautioned against using contact lenses from here on out until this totally heals.  Erythromycin ointment given in ED for home Pt expressed understanding  Final Clinical Impressions(s) / ED Diagnoses   Final diagnoses:  Acute conjunctivitis of right eye, unspecified acute conjunctivitis type    ED Discharge Orders        Ordered    ibuprofen (ADVIL,MOTRIN) 800 MG tablet  3 times daily     06/08/17 1044       Eber HongMiller, Charika Mikelson, MD 06/08/17 1045

## 2017-06-08 NOTE — ED Triage Notes (Signed)
Pt reports taking out her contacts last night (wears 30 days consistently.) reports pain and light sensitivity since this morning. Denies difficulty with removal.no contacts in place at this time.

## 2017-06-08 NOTE — Discharge Instructions (Signed)
You must see your eye doctor tomorrow. Ibuprofen for pain DO NOT WEAR CONTACT LENSES UNTIL CLEARED BY YOUR EYE DOCTOR Erythromycin ointment 3 or 4 times a day for the next 5 days.  You must see your eye doctor tomorrow

## 2017-06-22 DIAGNOSIS — E1165 Type 2 diabetes mellitus with hyperglycemia: Secondary | ICD-10-CM | POA: Insufficient documentation

## 2018-03-13 ENCOUNTER — Encounter (HOSPITAL_BASED_OUTPATIENT_CLINIC_OR_DEPARTMENT_OTHER): Payer: Self-pay | Admitting: *Deleted

## 2018-03-13 ENCOUNTER — Other Ambulatory Visit: Payer: Self-pay

## 2018-03-13 ENCOUNTER — Emergency Department (HOSPITAL_BASED_OUTPATIENT_CLINIC_OR_DEPARTMENT_OTHER)
Admission: EM | Admit: 2018-03-13 | Discharge: 2018-03-14 | Discharge status: Against Medical Advice | Payer: 59 | Attending: Emergency Medicine | Admitting: Emergency Medicine

## 2018-03-13 DIAGNOSIS — M79604 Pain in right leg: Secondary | ICD-10-CM | POA: Diagnosis present

## 2018-03-13 DIAGNOSIS — Z5321 Procedure and treatment not carried out due to patient leaving prior to being seen by health care provider: Secondary | ICD-10-CM | POA: Diagnosis not present

## 2018-03-13 NOTE — ED Triage Notes (Signed)
Right leg spasms that started tonight.

## 2018-03-14 ENCOUNTER — Other Ambulatory Visit: Payer: Self-pay

## 2018-03-14 NOTE — ED Notes (Signed)
Dr. Read DriversMolpus at the bedside to see the pt, pt not on her room at assessment time pt when out to her car.

## 2018-03-17 NOTE — ED Notes (Signed)
Follow up call made  No answer  03/17/18  1416   s Venisa Frampton rn

## 2018-03-18 ENCOUNTER — Encounter (HOSPITAL_BASED_OUTPATIENT_CLINIC_OR_DEPARTMENT_OTHER): Payer: Self-pay

## 2018-03-18 ENCOUNTER — Other Ambulatory Visit: Payer: Self-pay

## 2018-03-18 ENCOUNTER — Emergency Department (HOSPITAL_BASED_OUTPATIENT_CLINIC_OR_DEPARTMENT_OTHER)
Admission: EM | Admit: 2018-03-18 | Discharge: 2018-03-18 | Disposition: A | Discharge status: Home / Self Care | Payer: 59 | Attending: Emergency Medicine | Admitting: Emergency Medicine

## 2018-03-18 DIAGNOSIS — K5900 Constipation, unspecified: Secondary | ICD-10-CM | POA: Diagnosis not present

## 2018-03-18 DIAGNOSIS — M79652 Pain in left thigh: Secondary | ICD-10-CM

## 2018-03-18 DIAGNOSIS — Z79899 Other long term (current) drug therapy: Secondary | ICD-10-CM | POA: Insufficient documentation

## 2018-03-18 MED ORDER — CYCLOBENZAPRINE HCL 10 MG PO TABS
10.0000 mg | ORAL_TABLET | Freq: Once | ORAL | Status: AC
  Administered 2018-03-18: 10 mg via ORAL
  Filled 2018-03-18: qty 1

## 2018-03-18 MED ORDER — PREDNISONE 10 MG PO TABS: 30.0000 mg | ORAL_TABLET | Freq: Two times a day (BID) | ORAL | 0 refills | Status: AC

## 2018-03-18 MED ORDER — LIDOCAINE 5 % EX PTCH: 1.0000 | MEDICATED_PATCH | CUTANEOUS | 0 refills | Status: DC

## 2018-03-18 MED ORDER — DOCUSATE SODIUM 250 MG PO CAPS: 250.0000 mg | ORAL_CAPSULE | Freq: Every day | ORAL | 0 refills | Status: DC

## 2018-03-18 MED ORDER — HYDROMORPHONE HCL 1 MG/ML IJ SOLN
1.0000 mg | Freq: Once | INTRAMUSCULAR | Status: AC
  Administered 2018-03-18: 1 mg via INTRAMUSCULAR
  Filled 2018-03-18: qty 1

## 2018-03-18 MED ORDER — CYCLOBENZAPRINE HCL 10 MG PO TABS: 10.0000 mg | ORAL_TABLET | Freq: Two times a day (BID) | ORAL | 0 refills | Status: DC | PRN

## 2018-03-18 MED ORDER — MORPHINE SULFATE 15 MG PO TABS: 15.0000 mg | ORAL_TABLET | Freq: Four times a day (QID) | ORAL | 0 refills | Status: DC | PRN

## 2018-03-18 NOTE — ED Triage Notes (Signed)
C/o cont'd pain to left UE posterior thigh area-states she was also seen by PCP-to triage in w/c

## 2018-03-18 NOTE — ED Notes (Signed)
ED Provider at bedside. 

## 2018-03-18 NOTE — Discharge Instructions (Signed)
Take steroid as prescribed.  Do not take Naprosyn, Aleve, Motrin, Advil, or ibuprofen while taking this medicine.  You may take Tylenol every 6 hours as needed for pain in addition to the steroid.  You can also take morphine tablet as prescribed but do not drive, drink alcohol, or operate heavy machinery while taking this medicine as it may make you drowsy.  The same applies for the Flexeril which is a muscle relaxer.  I usually recommend only taking this medicine at night to help you get to sleep.  You can also apply lidocaine patch to areas of pain as prescribed.   Stop taking tramadol.  The morphine tablets will replace this.  If you decide to stop taking the gabapentin, taper down by reducing 1 tablet daily until you are not taking any tablets.  You may find it helpful to take a stool softener to help with the constipation that can be caused by the morphine tablets.  Apply heating pad 20 minutes on 20 minutes off.  Take some hot showers or hot baths to help relax muscles.  Do some gentle stretching throughout the day (I have attached exercises but you can also YouTube low back physical therapy videos that may be helpful as well).   Follow-up with your primary care physician for reevaluation of your symptoms.  They can write you a prescription for formal physical therapy.  You may find it helpful to follow-up with a neurosurgeon additionally.  Return to the emergency department if any concerning signs or symptoms develop such as fevers, weakness, bowel or bladder incontinence, or severe swelling.

## 2018-03-18 NOTE — ED Provider Notes (Signed)
[facility] EMERGENCY DEPARTMENT Provider Note   CSN: [id number] Arrival date & time: 03/18/18  1849     History   Chief Complaint Chief Complaint  Patient presents with  . Leg Pain    HPI Amy Matthews is a 37 y.o. female with history of obesity presents today for evaluation of acute onset, progressively worsening left posterior thigh pain for 5 days.  She states that on Saturday she was laying in bed when she began to feel discomfort to the left thigh.  She states that within 3 hours the pain became unbearable.  She notes constant sharp shooting pains that began at around the popliteal fossa and radiate upward to the buttock.  Pain worsens with standing upright and ambulating.  She states that the pain is somewhat alleviated with flexing forward.  Denies numbness, tingling, weakness, bowel or bladder incontinence, fevers, recent trauma or falls.  She was seen and evaluated at [facility] on Sunday and received IM steroid shot with significant improvement in her symptoms.  She was discharged with gabapentin which she has been taking 300 mg 3 times daily, Naprosyn, and tramadol.  She states that none of these medications have been helpful.  She now notes that she is constipated secondary to the tramadol.  She also underwent x-ray and ultrasound to evaluate for fracture and DVT at [facility], both of which were negative.  She denies recent travel or surgeries, no hemoptysis, no leg swelling, no prior history of DVT or PE, and she is not on OCPs.  She states that she is in the process of being set up for an MRI for further evaluation.  The history is provided by the patient.    Past Medical History:  Diagnosis Date  . HRP (high risk pregnancy)    prolonged hospitalization, cerclage done  . IUD (intrauterine device) in place 2006   Mirena  . Obesity   . Wears contact lenses     There are no active problems to display for this patient.   Past Surgical  History:  Procedure Laterality Date  . CERVICAL CERCLAGE    . CYST EXCISION  2004   under right breast  . DILATION AND CURETTAGE OF UTERUS  2010   due to miscarriage     OB History   None      Home Medications    Prior to Admission medications   Medication Sig Start Date End Date Taking? Authorizing Provider  gabapentin (NEURONTIN) 600 MG tablet Take 600 mg by mouth 3 (three) times daily.   Yes [provider]  albuterol (PROVENTIL HFA;VENTOLIN HFA) 108 (90 BASE) MCG/ACT inhaler Inhale 2 puffs into the lungs every 6 (six) hours as needed for wheezing or shortness of breath. 09/19/14   Tysinger, Kermit Baloavid S, PA-C  albuterol (PROVENTIL) (2.5 MG/3ML) 0.083% nebulizer solution Take 2.5 mg by nebulization every 6 (six) hours as needed for wheezing or shortness of breath.    [provider]  benzonatate (TESSALON) 200 MG capsule Take 1 capsule (200 mg total) by mouth 3 (three) times daily as needed for cough. 09/19/14   Tysinger, Kermit Baloavid S, PA-C  cyclobenzaprine (FLEXERIL) 10 MG tablet Take 1 tablet (10 mg total) by mouth 2 (two) times daily as needed for muscle spasms. 03/18/18   Luevenia MaxinFawze, Merita Hawks A, PA-C  docusate sodium (COLACE) 250 MG capsule Take 1 capsule (250 mg total) by mouth daily. 03/18/18   Airis Barbee A, PA-C  ibuprofen (ADVIL,MOTRIN) 800 MG tablet Take  1 tablet (800 mg total) 3 (three) times daily by mouth. 06/08/17   Eber Hong, MD  lidocaine (LIDODERM) 5 % Place 1 patch onto the skin daily. Remove & Discard patch within 12 hours or as directed by MD 03/18/18   Michela Pitcher A, PA-C  morphine (MSIR) 15 MG tablet Take 1 tablet (15 mg total) by mouth every 6 (six) hours as needed for severe pain. 03/18/18   Luevenia Maxin, Kees Idrovo A, PA-C  naproxen (NAPROSYN) 500 MG tablet Take 1 tablet (500 mg total) by mouth 2 (two) times daily with a meal. 10/11/16   Mathews Robinsons B, PA-C  predniSONE (DELTASONE) 10 MG tablet Take 3 tablets (30 mg total) by mouth 2 (two) times daily with a meal for 5  days. 03/18/18 03/23/18  Jeanie Sewer, PA-C    Family History Family History  Problem Relation Age of Onset  . Kidney disease Mother        dialysis  . Diabetes Mother   . Ulcers Father   . Heart disease Maternal Grandmother   . Other Maternal Grandfather        old age  . Dementia Paternal Grandmother   . Cancer Other        breast  . Hypertension Neg Hx   . Hyperlipidemia Neg Hx   . Stroke Neg Hx     Social History Social History   Tobacco Use  . Smoking status: Never Smoker  . Smokeless tobacco: Never Used  Substance Use Topics  . Alcohol use: Yes    Comment: social  . Drug use: No     Allergies   Patient has no known allergies.   Review of Systems Review of Systems  Constitutional: Negative for chills and fever.  Genitourinary: Negative for difficulty urinating.  Musculoskeletal: Positive for arthralgias and myalgias.  Neurological: Negative for weakness and numbness.  All other systems reviewed and are negative.    Physical Exam Updated Vital Signs BP (!) 156/82 (BP Location: Right Arm)   Pulse 99   Temp 98.3 F (36.8 C)   Resp 18   Ht 5' 5.5" (1.664 m)   Wt 99.7 kg   LMP 03/12/2018   SpO2 100%   BMI 36.02 kg/m   Physical Exam  Constitutional: She appears well-developed and well-nourished. No distress.  Patient standing, leaning forward at a 90 degree angle resting her arms on the bed.  She appears uncomfortable  HENT:  Head: Normocephalic and atraumatic.  Eyes: Conjunctivae are normal. Right eye exhibits no discharge. Left eye exhibits no discharge.  Neck: No JVD present. No tracheal deviation present.  Cardiovascular: Normal rate and intact distal pulses.  2+ DP/PT pulses bilaterally, no lower extremity edema, Homans sign absent bilaterally, no palpable cords, compartments are soft  Pulmonary/Chest: Effort normal.  Abdominal: She exhibits no distension.  Musculoskeletal: She exhibits tenderness. She exhibits no edema.  No midline spine  TTP, no paraspinal muscle tenderness, no deformity, crepitus, or step-off noted.  Tenderness to palpation to the left posterior thigh just inferior to the gluteus muscle.  No erythema, induration, or significant swelling noted.  No underlying crepitus or deformity.  5/5 strength of BLE major muscle groups, pain worsens with extension of the lumbar spine and flexion of the left hip.  Normal passive range of motion of the left hip.  Neurological: She is alert.  Fluent speech with no evidence of dysarthria or aphasia, no facial droop.  Sensation intact to soft touch of bilateral lower extremities.  Patient  ambulates with an antalgic gait, leaning forward.  Able to Heel Walk and Toe Walk without difficulty.  Skin: Skin is warm and dry. No erythema.  Psychiatric: She has a normal mood and affect. Her behavior is normal.  Nursing note and vitals reviewed.    ED Treatments / Results  Labs (all labs ordered are listed, but only abnormal results are displayed) Labs Reviewed - No data to display  EKG None  Radiology None  Procedures Procedures (including critical care time)  Medications Ordered in ED Medications  HYDROmorphone (DILAUDID) injection 1 mg (1 mg Intramuscular Given 03/18/18 1951)  cyclobenzaprine (FLEXERIL) tablet 10 mg (10 mg Oral Given 03/18/18 2023)     Initial Impression / Assessment and Plan / ED Course  I have reviewed the triage vital signs and the nursing notes.  Pertinent labs & imaging results that were available during my care of the patient were reviewed by me and considered in my medical decision making (see chart for details).     Patient with 5-day history of progressively worsening posterior left thigh pain extending from the gluteus muscle down the hamstring to the popliteal fossa.  She is neurovascularly intact.  No history of trauma.  She is afebrile, initially mildly tachycardic and hypertensive with improvement on reevaluation.  She does appear to be quite  uncomfortable which could explain her abnormal vital signs.  She ambulates with an antalgic gait, leaning forward.  No red flag signs concerning for cauda equina or spinal abscess.  No concern for AAA or dissection.  History and physical examination suggestive of lumbar radiculopathy likely in the L5 or S1 distribution versus tendinopathy.  She has had outpatient ultrasound ruling out DVT and radiographs have been negative as well.  She has had no relief with gabapentin, tramadol, and Naprosyn.  Compartments are soft, doubt compartment syndrome.  No signs of secondary skin infection.  She was given a shot of IM Decadron and p.o. Flexeril in the ED and on reevaluation she states that her pain has eased up somewhat and she is ambulating better and sitting upright comfortably.  [place] controlled substance registry was queried, patient does not receive regular refills of narcotic pain medicines.  I do not believe the patient to be drug-seeking at this time.  Will discharge with MSIR, Flexeril, steroid burst with instructions to discontinue Naprosyn and other NSAIDs, and lidocaine patches.  I instructed the patient to discontinue her tramadol.  Also discussed tapering down on gabapentin if she wishes to discontinue this medication.  Her PCP is working on approval for an MRI which I think is a reasonable next step.  We will give referral to neurosurgery for reevaluation and close follow-up with PCP.  Discussed strict ED return precautions.  Patient and patient's husband verbalized understanding of and agreement with plan and patient is stable for discharge home at this time.  Discussed with Dr. Clayborne Dana who agrees with assessment and plan at this time. Final Clinical Impressions(s) / ED Diagnoses   Final diagnoses:  Acute pain of left thigh    ED Discharge Orders         Ordered    cyclobenzaprine (FLEXERIL) 10 MG tablet  2 times daily PRN     03/18/18 2035    predniSONE (DELTASONE) 10 MG tablet  2 times  daily with meals     03/18/18 2035    lidocaine (LIDODERM) 5 %  Every 24 hours     03/18/18 2035    morphine (MSIR) 15  MG tablet  Every 6 hours PRN     03/18/18 2035    docusate sodium (COLACE) 250 MG capsule  Daily     03/18/18 2040           Bennye AlmFawze, Jilleen Essner A, PA-C 03/22/18 1009    Mesner, Barbara CowerJason, MD 03/22/18 1450

## 2018-03-21 ENCOUNTER — Emergency Department (HOSPITAL_BASED_OUTPATIENT_CLINIC_OR_DEPARTMENT_OTHER): Payer: 59

## 2018-03-21 ENCOUNTER — Encounter (HOSPITAL_BASED_OUTPATIENT_CLINIC_OR_DEPARTMENT_OTHER): Payer: Self-pay

## 2018-03-21 ENCOUNTER — Other Ambulatory Visit: Payer: Self-pay

## 2018-03-21 ENCOUNTER — Emergency Department (HOSPITAL_BASED_OUTPATIENT_CLINIC_OR_DEPARTMENT_OTHER)
Admission: EM | Admit: 2018-03-21 | Discharge: 2018-03-21 | Disposition: A | Discharge status: Home / Self Care | Payer: 59 | Attending: Emergency Medicine | Admitting: Emergency Medicine

## 2018-03-21 DIAGNOSIS — M5127 Other intervertebral disc displacement, lumbosacral region: Secondary | ICD-10-CM | POA: Insufficient documentation

## 2018-03-21 DIAGNOSIS — M5416 Radiculopathy, lumbar region: Secondary | ICD-10-CM | POA: Insufficient documentation

## 2018-03-21 DIAGNOSIS — R748 Abnormal levels of other serum enzymes: Secondary | ICD-10-CM | POA: Diagnosis not present

## 2018-03-21 DIAGNOSIS — M79605 Pain in left leg: Secondary | ICD-10-CM | POA: Diagnosis present

## 2018-03-21 DIAGNOSIS — Z79899 Other long term (current) drug therapy: Secondary | ICD-10-CM | POA: Insufficient documentation

## 2018-03-21 LAB — CBC WITH DIFFERENTIAL/PLATELET
BASOS PCT: 0 %
Basophils Absolute: 0 10*3/uL (ref 0.0–0.1)
EOS PCT: 2 %
Eosinophils Absolute: 0.1 10*3/uL (ref 0.0–0.7)
HCT: 37.5 % (ref 36.0–46.0)
HEMOGLOBIN: 12.6 g/dL (ref 12.0–15.0)
Lymphocytes Relative: 45 %
Lymphs Abs: 2.2 10*3/uL (ref 0.7–4.0)
MCH: 30.4 pg (ref 26.0–34.0)
MCHC: 33.6 g/dL (ref 30.0–36.0)
MCV: 90.6 fL (ref 78.0–100.0)
MONOS PCT: 10 %
Monocytes Absolute: 0.5 10*3/uL (ref 0.1–1.0)
NEUTROS PCT: 43 %
Neutro Abs: 2.1 10*3/uL (ref 1.7–7.7)
PLATELETS: 256 10*3/uL (ref 150–400)
RBC: 4.14 MIL/uL (ref 3.87–5.11)
RDW: 11.8 % (ref 11.5–15.5)
WBC: 4.9 10*3/uL (ref 4.0–10.5)

## 2018-03-21 LAB — BASIC METABOLIC PANEL
Anion gap: 10 (ref 5–15)
BUN: 14 mg/dL (ref 6–20)
CALCIUM: 8.8 mg/dL — AB (ref 8.9–10.3)
CHLORIDE: 100 mmol/L (ref 98–111)
CO2: 26 mmol/L (ref 22–32)
CREATININE: 0.99 mg/dL (ref 0.44–1.00)
GFR calc non Af Amer: >60 mL/min (ref >60)
Glucose, Bld: 183 mg/dL — ABNORMAL HIGH (ref 70–99)
Potassium: 4.3 mmol/L (ref 3.5–5.1)
SODIUM: 136 mmol/L (ref 135–145)

## 2018-03-21 LAB — HEPATIC FUNCTION PANEL
ALK PHOS: 41 U/L (ref 38–126)
ALT: 16 U/L (ref 0–44)
AST: 37 U/L (ref 15–41)
Albumin: 4 g/dL (ref 3.5–5.0)
BILIRUBIN DIRECT: 0.4 mg/dL — AB (ref 0.0–0.2)
BILIRUBIN TOTAL: 1.3 mg/dL — AB (ref 0.3–1.2)
Indirect Bilirubin: 0.9 mg/dL (ref 0.3–0.9)
TOTAL PROTEIN: 7.6 g/dL (ref 6.5–8.1)

## 2018-03-21 LAB — HCG, QUANTITATIVE, PREGNANCY: hCG, Beta Chain, Quant, S: <1 m[IU]/mL (ref <5)

## 2018-03-21 LAB — D-DIMER, QUANTITATIVE (NOT AT ARMC)

## 2018-03-21 LAB — CK: CK TOTAL: 664 U/L — AB (ref 38–234)

## 2018-03-21 MED ORDER — METHYLPREDNISOLONE 4 MG PO TBPK: ORAL_TABLET | ORAL | 0 refills | Status: DC

## 2018-03-21 MED ORDER — KETOROLAC TROMETHAMINE 15 MG/ML IJ SOLN
30.0000 mg | Freq: Once | INTRAMUSCULAR | Status: AC
  Administered 2018-03-21: 30 mg via INTRAVENOUS
  Filled 2018-03-21: qty 2

## 2018-03-21 MED ORDER — SODIUM CHLORIDE 0.9 % IV BOLUS
1000.0000 mL | Freq: Once | INTRAVENOUS | Status: AC
  Administered 2018-03-21: 1000 mL via INTRAVENOUS

## 2018-03-21 MED ORDER — MORPHINE SULFATE (PF) 4 MG/ML IV SOLN
4.0000 mg | Freq: Once | INTRAVENOUS | Status: AC
  Administered 2018-03-21: 4 mg via INTRAVENOUS
  Filled 2018-03-21: qty 1

## 2018-03-21 MED ORDER — IOPAMIDOL (ISOVUE-300) INJECTION 61%
100.0000 mL | Freq: Once | INTRAVENOUS | Status: AC | PRN
  Administered 2018-03-21: 100 mL via INTRAVENOUS

## 2018-03-21 MED ORDER — LORAZEPAM 2 MG/ML IJ SOLN
2.0000 mg | Freq: Once | INTRAMUSCULAR | Status: AC
  Administered 2018-03-21: 2 mg via INTRAVENOUS
  Filled 2018-03-21: qty 1

## 2018-03-21 MED ORDER — IOPAMIDOL (ISOVUE-300) INJECTION 61%: 100.0000 mL | Freq: Once | INTRAVENOUS | Status: DC | PRN

## 2018-03-21 NOTE — ED Provider Notes (Addendum)
[facility] EMERGENCY DEPARTMENT Provider Note   CSN: [id number] Arrival date & time: 03/21/18  0012     History   Chief Complaint Chief Complaint  Patient presents with  . Leg Pain    Left    HPI Amy Matthews is a 37 y.o. female.  HPI   37 yo F here with severe L leg pain. Pt states that for the past week she has had severe, 10/10, sharp, stabbing, left hamstring/knee pain. She has been seen twice for this and had neg DVT study and plain films. She ahs been on oral morphine, gabapentin, analgesics w/o relief. She states her thigh pain is now increasingly severe, burning, and relentless and she cannot sleep so she presents for eval. Pain is worse with stretching her leg. No alleviating factors. Denies any specific injuries. No recent increased use of her legs.  Past Medical History:  Diagnosis Date  . HRP (high risk pregnancy)    prolonged hospitalization, cerclage done  . IUD (intrauterine device) in place 2006   Mirena  . Obesity   . Wears contact lenses     There are no active problems to display for this patient.   Past Surgical History:  Procedure Laterality Date  . CERVICAL CERCLAGE    . CYST EXCISION  2004   under right breast  . DILATION AND CURETTAGE OF UTERUS  2010   due to miscarriage     OB History   None      Home Medications    Prior to Admission medications   Medication Sig Start Date End Date Taking? Authorizing Provider  albuterol (PROVENTIL HFA;VENTOLIN HFA) 108 (90 BASE) MCG/ACT inhaler Inhale 2 puffs into the lungs every 6 (six) hours as needed for wheezing or shortness of breath. 09/19/14   Tysinger, Kermit Balo, PA-C  albuterol (PROVENTIL) (2.5 MG/3ML) 0.083% nebulizer solution Take 2.5 mg by nebulization every 6 (six) hours as needed for wheezing or shortness of breath.    [provider]  benzonatate (TESSALON) 200 MG capsule Take 1 capsule (200 mg total) by mouth 3 (three) times daily as needed for cough. 09/19/14    Tysinger, Kermit Balo, PA-C  cyclobenzaprine (FLEXERIL) 10 MG tablet Take 1 tablet (10 mg total) by mouth 2 (two) times daily as needed for muscle spasms. 03/18/18   Luevenia Maxin, Mina A, PA-C  docusate sodium (COLACE) 250 MG capsule Take 1 capsule (250 mg total) by mouth daily. 03/18/18   Fawze, Mina A, PA-C  gabapentin (NEURONTIN) 600 MG tablet Take 600 mg by mouth 3 (three) times daily.    [provider]  ibuprofen (ADVIL,MOTRIN) 800 MG tablet Take 1 tablet (800 mg total) 3 (three) times daily by mouth. 06/08/17   Eber Hong, MD  lidocaine (LIDODERM) 5 % Place 1 patch onto the skin daily. Remove & Discard patch within 12 hours or as directed by MD 03/18/18   Michela Pitcher A, PA-C  methylPREDNISolone (MEDROL DOSEPAK) 4 MG tablet Take by mouth. follow package directions    [provider]  morphine (MSIR) 15 MG tablet Take 1 tablet (15 mg total) by mouth every 6 (six) hours as needed for severe pain. 03/18/18   Luevenia Maxin, Mina A, PA-C  naproxen (NAPROSYN) 500 MG tablet Take 1 tablet (500 mg total) by mouth 2 (two) times daily with a meal. 10/11/16   Mathews Robinsons B, PA-C  predniSONE (DELTASONE) 10 MG tablet Take 3 tablets (30 mg total) by mouth 2 (two) times daily with a meal  for 5 days. 03/18/18 03/23/18  Jeanie Sewer, PA-C    Family History Family History  Problem Relation Age of Onset  . Kidney disease Mother        dialysis  . Diabetes Mother   . Ulcers Father   . Heart disease Maternal Grandmother   . Other Maternal Grandfather        old age  . Dementia Paternal Grandmother   . Cancer Other        breast  . Hypertension Neg Hx   . Hyperlipidemia Neg Hx   . Stroke Neg Hx     Social History Social History   Tobacco Use  . Smoking status: Never Smoker  . Smokeless tobacco: Never Used  Substance Use Topics  . Alcohol use: Yes    Comment: social  . Drug use: No     Allergies   Patient has no known allergies.   Review of Systems Review of Systems  Constitutional:  Positive for fatigue. Negative for chills and fever.  HENT: Negative for congestion and rhinorrhea.   Eyes: Negative for visual disturbance.  Respiratory: Negative for cough, shortness of breath and wheezing.   Cardiovascular: Negative for chest pain and leg swelling.  Gastrointestinal: Negative for abdominal pain, diarrhea, nausea and vomiting.  Genitourinary: Negative for dysuria and flank pain.  Musculoskeletal: Positive for arthralgias and gait problem. Negative for neck pain and neck stiffness.  Skin: Negative for rash and wound.  Allergic/Immunologic: Negative for immunocompromised state.  Neurological: Negative for syncope, weakness and headaches.  All other systems reviewed and are negative.    Physical Exam Updated Vital Signs BP (!) 144/79   Pulse 98   Temp 97.6 F (36.4 C) (Oral)   Resp 20   LMP 03/12/2018   SpO2 100%   Physical Exam  Constitutional: She is oriented to person, place, and time. She appears well-developed and well-nourished. She appears distressed.  Tearful, distressed from pain  HENT:  Head: Normocephalic and atraumatic.  Eyes: Conjunctivae are normal.  Neck: Neck supple.  Cardiovascular: Normal rate, regular rhythm and normal heart sounds. Exam reveals no friction rub.  No murmur heard. Pulmonary/Chest: Effort normal and breath sounds normal. No respiratory distress. She has no wheezes. She has no rales.  Abdominal: Soft. Bowel sounds are normal. She exhibits no distension. There is no tenderness. There is no rebound and no guarding.  Musculoskeletal: She exhibits no edema.  Mild lower lumbar TTP  Neurological: She is alert and oriented to person, place, and time. She exhibits normal muscle tone.  Skin: Skin is warm. Capillary refill takes less than 2 seconds.  Psychiatric: She has a normal mood and affect.  Nursing note and vitals reviewed.   Spine Exam: Inspection/Palpation: Mild lower lumbar midline TTP.  Strength: 5/5 throughout LE  bilaterally (hip flexion/extension, adduction/abduction; knee flexion/extension; foot dorsiflexion/plantarflexion, inversion/eversion; great toe inversion) Sensation: Intact to light touch in proximal and distal LE bilaterally Reflexes: 2+ quadriceps and achilles reflexes   LOWER EXTREMITY EXAM: LEFT  INSPECTION & PALPATION: Exquisite TTP over left posterior thigh extending from proximal to distal hamstring. No palpable cords, swelling, or warmth.  SENSORY: sensation is intact to light touch in:  Superficial peroneal nerve distribution (over dorsum of foot) Deep peroneal nerve distribution (over first dorsal web space) Sural nerve distribution (over lateral aspect 5th metatarsal) Saphenous nerve distribution (over medial instep)  MOTOR:  + Motor EHL (great toe dorsiflexion) + FHL (great toe plantar flexion)  + TA (ankle dorsiflexion)  +  GSC (ankle plantar flexion)  VASCULAR: 2+ dorsalis pedis and posterior tibialis pulses Capillary refill < 2 sec, toes warm and well-perfused  COMPARTMENTS: Soft, warm, well-perfused No pain with passive extension No parethesias    ED Treatments / Results  Labs (all labs ordered are listed, but only abnormal results are displayed) Labs Reviewed  BASIC METABOLIC PANEL - Abnormal; Notable for the following components:      Result Value   Glucose, Bld 183 (*)    Calcium 8.8 (*)    All other components within normal limits  CK - Abnormal; Notable for the following components:   Total CK 664 (*)    All other components within normal limits  CBC WITH DIFFERENTIAL/PLATELET  D-DIMER, QUANTITATIVE (NOT AT [facility])  HCG, QUANTITATIVE, PREGNANCY    EKG None  Radiology Ct Lumbar Spine Wo Contrast  Result Date: 03/21/2018 CLINICAL DATA:  10/10 LEFT leg pain.  Follow up abnormal radiograph. EXAM: CT LUMBAR SPINE WITHOUT CONTRAST TECHNIQUE: Multidetector CT imaging of the lumbar spine was performed without intravenous contrast administration.  Multiplanar CT image reconstructions were also generated. COMPARISON:  None. FINDINGS: SEGMENTATION: For the purposes of this report the last well-formed intervertebral disc space is reported as L5-S1. ALIGNMENT: Maintained lumbar lordosis. No malalignment. VERTEBRAE: Vertebral bodies and posterior elements are intact. Mild lower thoracic and upper lumbar ventral endplate spurring. No destructive bony lesions. L5-S1 disc height loss with vacuum disc. No destructive bony lesions. PARASPINAL AND OTHER SOFT TISSUES: Included prevertebral and paraspinal soft tissues are unremarkable. DISC LEVELS: T12-L1 through L3-4: No disc bulge, canal stenosis nor neural foraminal narrowing. L4-5: Moderate central disc protrusion. Minimal facet arthropathy and ligamentum flavum redundancy. Mild canal stenosis. Minimal neural foraminal narrowing. L5-S1: Moderate broad-based disc bulge. Mild facet arthropathy. No canal stenosis. Mild neural foraminal narrowing. IMPRESSION: 1. No fracture or malalignment. 2. 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 3. Minimal to mild L4-5 and L5-S1 neural foraminal narrowing. Electronically Signed   By: Awilda Metro M.D.   On: 03/21/2018 04:59   Ct Extremity Lower Left W Contrast  Result Date: 03/21/2018 CLINICAL DATA:  Leg swelling or pain, DVT suspected. Exquisite Left thigh pain, eval for deep infection, mass, muscle tear EXAM: CT OF THE LOWER LEFT EXTREMITY WITH CONTRAST TECHNIQUE: Multidetector CT imaging of the lower left extremity was performed according to the standard protocol following intravenous contrast administration. COMPARISON:  None. CONTRAST:  ISOVUE-300 IOPAMIDOL (ISOVUE-300) INJECTION 61% FINDINGS: Bones/Joint/Cartilage Left hip joint, knee joint, and ankle joint are unremarkable. No acute fracture from the iliac crest through the foot. No osseous erosions, periosteal reaction or focal bone lesion. No significant joint  effusions allowing for large field-of-view imaging. Ligaments Suboptimally assessed by CT. Muscles and Tendons No evidence of intramuscular fluid collection. No peritendinous inflammation to suggest muscle tear. No CT findings of focal muscle mass. Soft tissues No subcutaneous fluid collection. Cannot assess for DVT given phase of contrast. Small popliteal node. IMPRESSION: 1. No explanation for left thigh pain. 2. Essentially nondiagnostic evaluation for DVT given phase of contrast, recommend duplex ultrasound evaluation. 3. There is persisting clinical concern for muscle tear, MRI recommended. Electronically Signed   By: Rubye Oaks M.D.   On: 03/21/2018 05:03    Procedures Procedures (including critical care time)  Medications Ordered in ED Medications  iopamidol (ISOVUE-300) 61 % injection 100 mL (has no administration in time range)  LORazepam (ATIVAN) injection 2 mg (2 mg Intravenous Given 03/21/18 0053)  sodium chloride 0.9 % bolus 1,000 mL (0 mLs Intravenous Stopped 03/21/18 0154)  ketorolac (TORADOL) 15 MG/ML injection 30 mg (30 mg Intravenous Given 03/21/18 0053)  sodium chloride 0.9 % bolus 1,000 mL (0 mLs Intravenous Stopped 03/21/18 0259)  iopamidol (ISOVUE-300) 61 % injection 100 mL (100 mLs Intravenous Contrast Given 03/21/18 0248)  morphine 4 MG/ML injection 4 mg (4 mg Intravenous Given 03/21/18 0403)     Initial Impression / Assessment and Plan / ED Course  I have reviewed the triage vital signs and the nursing notes.  Pertinent labs & imaging results that were available during my care of the patient were reviewed by me and considered in my medical decision making (see chart for details).     37 yo F here with severe L thigh pain, failing outpt steroids, muscle relaxants, and morphine. Clinically, concern for severe lumbosacral radiculopathy versus hamstring tear. Lab work today shows elevated CK of 664 - concerning for myositis, also possible muscle degeneration in setting  of new disc herniation. CT imaging obtained and shows L4-5 and L5-S1 disc disease, but is o/w unremarkable (note, delay in imaging results 2/2 difficult obtaining pregnancy test, images not crossing over in PACS). Pt with persistent severe pain here. Discussed management options with pt including admission, increased meds at home, or MRI. Given her worsening and persistent pain with new CK elevation, will check MRI to eval for severe disc herniation/compression, myositis, tear. She would prefer to stay here and MR is available this early AM. Pt would be amenable to outpt management if no acute surgical intervention needed at this time.  Patient care transferred to Dr. Criss AlvineGoldston at the end of my shift. Patient presentation, ED course, and plan of care discussed with review of all pertinent labs and imaging. Please see his/her note for further details regarding further ED course and disposition.   Final Clinical Impressions(s) / ED Diagnoses   Final diagnoses:  Lumbar radiculopathy  Elevated CK    ED Discharge Orders    None       Shaune PollackIsaacs, Kenyatta Gloeckner, MD 03/21/18 [id number]    Shaune PollackIsaacs, Anabella Capshaw, MD 03/21/18 [id number]

## 2018-03-21 NOTE — ED Provider Notes (Signed)
Patient is feeling better.  She has no weakness or numbness in her extremities or bowel/bladder incontinence.  See results below, but she does have L5-S1 disc herniation, worse on the left.  The leg pain is worse on the posterior aspect of her thigh which could correlate.  However there is also a little bit of possible tendinopathy or inflammation as well.  Continue treatments.  I discussed with Leo Grosser of [place] [facility], recommends Medrol Dosepak.  However after further discussion, patient is actually also already on prednisone.  We will continue prednisone and not do further steroids.  She is to call in 2 days for an appointment this upcoming week.  We discussed strict return precautions.  Results for orders placed or performed during the hospital encounter of 03/21/18  CBC with Differential  Result Value Ref Range   WBC 4.9 4.0 - 10.5 K/uL   RBC 4.14 3.87 - 5.11 MIL/uL   Hemoglobin 12.6 12.0 - 15.0 g/dL   HCT 16.1 09.6 - 04.5 %   MCV 90.6 78.0 - 100.0 fL   MCH 30.4 26.0 - 34.0 pg   MCHC 33.6 30.0 - 36.0 g/dL   RDW 40.9 81.1 - 91.4 %   Platelets 256 150 - 400 K/uL   Neutrophils Relative % 43 %   Neutro Abs 2.1 1.7 - 7.7 K/uL   Lymphocytes Relative 45 %   Lymphs Abs 2.2 0.7 - 4.0 K/uL   Monocytes Relative 10 %   Monocytes Absolute 0.5 0.1 - 1.0 K/uL   Eosinophils Relative 2 %   Eosinophils Absolute 0.1 0.0 - 0.7 K/uL   Basophils Relative 0 %   Basophils Absolute 0.0 0.0 - 0.1 K/uL  Basic metabolic panel  Result Value Ref Range   Sodium 136 135 - 145 mmol/L   Potassium 4.3 3.5 - 5.1 mmol/L   Chloride 100 98 - 111 mmol/L   CO2 26 22 - 32 mmol/L   Glucose, Bld 183 (H) 70 - 99 mg/dL   BUN 14 6 - 20 mg/dL   Creatinine, Ser 7.82 0.44 - 1.00 mg/dL   Calcium 8.8 (L) 8.9 - 10.3 mg/dL   GFR calc non Af Amer >60 >60 mL/min   GFR calc Af Amer >60 >60 mL/min   Anion gap 10 5 - 15  CK  Result Value Ref Range   Total CK 664 (H) 38 - 234 U/L  D-dimer, quantitative (not at [facility])   Result Value Ref Range   D-Dimer, Quant <0.27 0.00 - 0.50 ug/mL-FEU  hCG, quantitative, pregnancy  Result Value Ref Range   hCG, Beta Chain, Quant, S <1 <5 mIU/mL  Hepatic function panel  Result Value Ref Range   Total Protein 7.6 6.5 - 8.1 g/dL   Albumin 4.0 3.5 - 5.0 g/dL   AST 37 15 - 41 U/L   ALT 16 0 - 44 U/L   Alkaline Phosphatase 41 38 - 126 U/L   Total Bilirubin 1.3 (H) 0.3 - 1.2 mg/dL   Bilirubin, Direct 0.4 (H) 0.0 - 0.2 mg/dL   Indirect Bilirubin 0.9 0.3 - 0.9 mg/dL   Ct Lumbar Spine Wo Contrast  Result Date: 03/21/2018 CLINICAL DATA:  10/10 LEFT leg pain.  Follow up abnormal radiograph. EXAM: CT LUMBAR SPINE WITHOUT CONTRAST TECHNIQUE: Multidetector CT imaging of the lumbar spine was performed without intravenous contrast administration. Multiplanar CT image reconstructions were also generated. COMPARISON:  None. FINDINGS: SEGMENTATION: For the purposes of this report the last well-formed intervertebral disc space is reported  as L5-S1. ALIGNMENT: Maintained lumbar lordosis. No malalignment. VERTEBRAE: Vertebral bodies and posterior elements are intact. Mild lower thoracic and upper lumbar ventral endplate spurring. No destructive bony lesions. L5-S1 disc height loss with vacuum disc. No destructive bony lesions. PARASPINAL AND OTHER SOFT TISSUES: Included prevertebral and paraspinal soft tissues are unremarkable. DISC LEVELS: T12-L1 through L3-4: No disc bulge, canal stenosis nor neural foraminal narrowing. L4-5: Moderate central disc protrusion. Minimal facet arthropathy and ligamentum flavum redundancy. Mild canal stenosis. Minimal neural foraminal narrowing. L5-S1: Moderate broad-based disc bulge. Mild facet arthropathy. No canal stenosis. Mild neural foraminal narrowing. IMPRESSION: 1. No fracture or malalignment. 2. 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 3. Minimal to mild L4-5 and L5-S1 neural foraminal narrowing. Electronically  Signed   By: Awilda Metroourtnay  Bloomer M.D.   On: 03/21/2018 04:59   Mr Lumbar Spine Wo Contrast  Result Date: 03/21/2018 CLINICAL DATA:  Low back and left leg pain over the last week. Left leg weakness. EXAM: MRI LUMBAR SPINE WITHOUT CONTRAST TECHNIQUE: Multiplanar, multisequence MR imaging of the lumbar spine was performed. No intravenous contrast was administered. COMPARISON:  CT same day FINDINGS: Segmentation:  5 lumbar type vertebral bodies. Alignment:  Normal Vertebrae:  Normal Conus medullaris and cauda equina: Conus extends to the L1-2 level. Conus and cauda equina appear normal. Paraspinal and other soft tissues: Negative Disc levels: No abnormality at L2-3 or above. L3-4: Normal appearance of the disc. Mild facet hypertrophy. No stenosis. L4-5: Disc degeneration with annular fissures and annular bulging. This indents the thecal sac but does not appear to cause neural compression. L5-S1: Large broad-based disc herniation, worse on the left than the right with a disc fragment measuring at least a cm. This could compress either or both S1 nerves, particularly the left. IMPRESSION: Large disc herniation at L5-S1 more prominent towards the left. Disc fragment measuring at least a cm in size. Either S1 nerve could be compressed, especially the left. L4-5 annular bulge with annular fissures but no apparent compressive stenosis. Electronically Signed   By: Paulina FusiMark  Shogry M.D.   On: 03/21/2018 11:38   Mr Femur Left Wo Contrast  Result Date: 03/21/2018 CLINICAL DATA:  Left thigh pain for 1 week.  Left leg weakness. EXAM: MR OF THE LEFT FEMUR WITHOUT CONTRAST TECHNIQUE: Multiplanar, multisequence MR imaging of the left femur was performed. No intravenous contrast was administered. COMPARISON:  Radiographs of the left hip from 10/11/2016 FINDINGS: Bones/Joint/Cartilage No acute bony abnormality of the left femur is identified. No hip joint effusion. There is a trace knee joint effusion and a moderate-sized Baker's  cyst. Ligaments N/A Muscles and Tendons No significant abnormal regional muscular edema. There is some mild proximal hamstring tendinopathy on the left. Soft tissues Suspected small benign left ovarian cyst 2.0 cm in diameter. Low-level subcutaneous edema along the left distal medial thigh. IMPRESSION: 1. No bony abnormality of the left femur is identified. Trace knee joint effusion with a moderate-sized Baker's cyst in the knee. 2. Small left ovarian cyst partially imaged on today's exam. 3. Mild subcutaneous edema medially along the distal thigh. 4. Mild proximal hamstring tendinopathy on the left. Electronically Signed   By: Gaylyn RongWalter  Liebkemann M.D.   On: 03/21/2018 11:43   Ct Extremity Lower Left W Contrast  Result Date: 03/21/2018 CLINICAL DATA:  Leg swelling or pain, DVT suspected. Exquisite Left thigh pain, eval for deep infection, mass, muscle tear EXAM: CT OF THE LOWER LEFT EXTREMITY WITH CONTRAST TECHNIQUE: Multidetector CT  imaging of the lower left extremity was performed according to the standard protocol following intravenous contrast administration. COMPARISON:  None. CONTRAST:  ISOVUE-300 IOPAMIDOL (ISOVUE-300) INJECTION 61% FINDINGS: Bones/Joint/Cartilage Left hip joint, knee joint, and ankle joint are unremarkable. No acute fracture from the iliac crest through the foot. No osseous erosions, periosteal reaction or focal bone lesion. No significant joint effusions allowing for large field-of-view imaging. Ligaments Suboptimally assessed by CT. Muscles and Tendons No evidence of intramuscular fluid collection. No peritendinous inflammation to suggest muscle tear. No CT findings of focal muscle mass. Soft tissues No subcutaneous fluid collection. Cannot assess for DVT given phase of contrast. Small popliteal node. IMPRESSION: 1. No explanation for left thigh pain. 2. Essentially nondiagnostic evaluation for DVT given phase of contrast, recommend duplex ultrasound evaluation. 3. There is  persisting clinical concern for muscle tear, MRI recommended. Electronically Signed   By: Rubye Oaks M.D.   On: 03/21/2018 05:03      Pricilla Loveless, MD 03/21/18 1217

## 2018-03-21 NOTE — ED Notes (Signed)
Patient transported to CT 

## 2018-03-21 NOTE — ED Notes (Signed)
Patient transported to MRI 

## 2018-03-21 NOTE — ED Notes (Signed)
Provider aware of patients request for pain medication

## 2018-03-21 NOTE — Discharge Instructions (Addendum)
Continue taking the prednisone as prescribed.  Follow-up with the neurosurgeon, calling on 8/26 for an appointment.  If you develop worsening or intractable pain, fever, weakness or numbness in your legs, incontinence of your bowel or bladder, or any other new/concerning symptoms and return to the ER for evaluation.

## 2018-03-21 NOTE — ED Notes (Addendum)
Patient is resting comfortably, with eyes closed . Resp even and unlabored. Awaiting MRI

## 2018-03-21 NOTE — ED Notes (Signed)
Pt remains in MRI 

## 2018-03-21 NOTE — ED Notes (Signed)
Patient is resting comfortably, continues to wait for MRI

## 2018-03-21 NOTE — ED Triage Notes (Signed)
Pt c/o worsening and persistent 10/10 left leg pain w/o relief from Rx meds. [facility] Pt A+XO, grimacing and heavily breathing.

## 2018-05-01 ENCOUNTER — Other Ambulatory Visit: Payer: Self-pay

## 2018-05-01 ENCOUNTER — Ambulatory Visit (HOSPITAL_BASED_OUTPATIENT_CLINIC_OR_DEPARTMENT_OTHER)
Admission: EM | Admit: 2018-05-01 | Discharge: 2018-05-03 | Disposition: A | Discharge status: Home / Self Care | Payer: 59 | Attending: Neurosurgery | Admitting: Neurosurgery

## 2018-05-01 ENCOUNTER — Encounter (HOSPITAL_BASED_OUTPATIENT_CLINIC_OR_DEPARTMENT_OTHER): Payer: Self-pay | Admitting: *Deleted

## 2018-05-01 DIAGNOSIS — Z79891 Long term (current) use of opiate analgesic: Secondary | ICD-10-CM | POA: Diagnosis not present

## 2018-05-01 DIAGNOSIS — K6289 Other specified diseases of anus and rectum: Secondary | ICD-10-CM

## 2018-05-01 DIAGNOSIS — E669 Obesity, unspecified: Secondary | ICD-10-CM | POA: Insufficient documentation

## 2018-05-01 DIAGNOSIS — R339 Retention of urine, unspecified: Secondary | ICD-10-CM

## 2018-05-01 DIAGNOSIS — Z791 Long term (current) use of non-steroidal anti-inflammatories (NSAID): Secondary | ICD-10-CM | POA: Insufficient documentation

## 2018-05-01 DIAGNOSIS — M5106 Intervertebral disc disorders with myelopathy, lumbar region: Secondary | ICD-10-CM | POA: Insufficient documentation

## 2018-05-01 DIAGNOSIS — Z79899 Other long term (current) drug therapy: Secondary | ICD-10-CM | POA: Diagnosis not present

## 2018-05-01 DIAGNOSIS — M5117 Intervertebral disc disorders with radiculopathy, lumbosacral region: Secondary | ICD-10-CM | POA: Diagnosis present

## 2018-05-01 DIAGNOSIS — G834 Cauda equina syndrome: Secondary | ICD-10-CM | POA: Diagnosis not present

## 2018-05-01 DIAGNOSIS — Z419 Encounter for procedure for purposes other than remedying health state, unspecified: Secondary | ICD-10-CM

## 2018-05-01 DIAGNOSIS — Z6838 Body mass index (BMI) 38.0-38.9, adult: Secondary | ICD-10-CM | POA: Insufficient documentation

## 2018-05-01 DIAGNOSIS — Z9889 Other specified postprocedural states: Secondary | ICD-10-CM

## 2018-05-01 DIAGNOSIS — E119 Type 2 diabetes mellitus without complications: Secondary | ICD-10-CM | POA: Diagnosis not present

## 2018-05-01 LAB — CBC WITH DIFFERENTIAL/PLATELET
BASOS PCT: 0 %
Basophils Absolute: 0 10*3/uL (ref 0.0–0.1)
EOS ABS: 0.2 10*3/uL (ref 0.0–0.7)
EOS PCT: 3 %
HCT: 38.4 % (ref 36.0–46.0)
HEMOGLOBIN: 13 g/dL (ref 12.0–15.0)
Lymphocytes Relative: 12 %
Lymphs Abs: 0.8 10*3/uL (ref 0.7–4.0)
MCH: 30.5 pg (ref 26.0–34.0)
MCHC: 33.9 g/dL (ref 30.0–36.0)
MCV: 90.1 fL (ref 78.0–100.0)
Monocytes Absolute: 0.6 10*3/uL (ref 0.1–1.0)
Monocytes Relative: 9 %
NEUTROS PCT: 76 %
Neutro Abs: 4.9 10*3/uL (ref 1.7–7.7)
PLATELETS: 236 10*3/uL (ref 150–400)
RBC: 4.26 MIL/uL (ref 3.87–5.11)
RDW: 11.9 % (ref 11.5–15.5)
WBC: 6.4 10*3/uL (ref 4.0–10.5)

## 2018-05-01 LAB — COMPREHENSIVE METABOLIC PANEL
ALK PHOS: 41 U/L (ref 38–126)
ALT: 18 U/L (ref 0–44)
ANION GAP: 10 (ref 5–15)
AST: 16 U/L (ref 15–41)
Albumin: 3.8 g/dL (ref 3.5–5.0)
BILIRUBIN TOTAL: 0.9 mg/dL (ref 0.3–1.2)
BUN: 14 mg/dL (ref 6–20)
CALCIUM: 9.2 mg/dL (ref 8.9–10.3)
CO2: 23 mmol/L (ref 22–32)
Chloride: 103 mmol/L (ref 98–111)
Creatinine, Ser: 1.11 mg/dL — ABNORMAL HIGH (ref 0.44–1.00)
Glucose, Bld: 174 mg/dL — ABNORMAL HIGH (ref 70–99)
Potassium: 4 mmol/L (ref 3.5–5.1)
SODIUM: 136 mmol/L (ref 135–145)
TOTAL PROTEIN: 7.6 g/dL (ref 6.5–8.1)

## 2018-05-01 LAB — URINALYSIS, ROUTINE W REFLEX MICROSCOPIC
BILIRUBIN URINE: NEGATIVE
GLUCOSE, UA: NEGATIVE mg/dL
HGB URINE DIPSTICK: NEGATIVE
Ketones, ur: NEGATIVE mg/dL
Leukocytes, UA: NEGATIVE
Nitrite: NEGATIVE
PH: 5.5 (ref 5.0–8.0)
Protein, ur: NEGATIVE mg/dL
Specific Gravity, Urine: 1.025 (ref 1.005–1.030)

## 2018-05-01 LAB — LIPASE, BLOOD: LIPASE: 21 U/L (ref 11–51)

## 2018-05-01 LAB — PREGNANCY, URINE: PREG TEST UR: NEGATIVE

## 2018-05-01 MED ORDER — MORPHINE SULFATE (PF) 4 MG/ML IV SOLN: 4.0000 mg | Freq: Once | INTRAVENOUS | Status: DC

## 2018-05-01 MED ORDER — HYDROMORPHONE HCL 1 MG/ML IJ SOLN
1.0000 mg | Freq: Once | INTRAMUSCULAR | Status: AC
  Administered 2018-05-01: 1 mg via INTRAVENOUS
  Filled 2018-05-01: qty 1

## 2018-05-01 NOTE — ED Notes (Signed)
ED Provider at bedside. 

## 2018-05-01 NOTE — ED Provider Notes (Signed)
[facility] EMERGENCY DEPARTMENT Provider Note   CSN: [id number] Arrival date & time: 05/01/18  2138     History   Chief Complaint No chief complaint on file.   HPI Amy Matthews is a 37 y.o. female with past medical history of L5-S1 large disc herniation, diabetes, obesity, who presents today for evaluation of abdominal pain.  She has recently been being treated for her back pain with morphine 15 mg IR, and steroids.  She reports that she went to her pain management doctor yesterday, she has not been able to see her neurosurgeon since diagnosis of bulging disc.  She denies any injury since then.  She primarily reports left lower quadrant abdominal pain today and worsening back pain.  She says that she has been peeing on herself and is unable to feel herself pee.  She reports constipation.  She had an enema today with out improvement.    HPI  Past Medical History:  Diagnosis Date  . HRP (high risk pregnancy)    prolonged hospitalization, cerclage done  . IUD (intrauterine device) in place 2006   Mirena  . Obesity   . Wears contact lenses     There are no active problems to display for this patient.   Past Surgical History:  Procedure Laterality Date  . CERVICAL CERCLAGE    . CYST EXCISION  2004   under right breast  . DILATION AND CURETTAGE OF UTERUS  2010   due to miscarriage     OB History   None      Home Medications    Prior to Admission medications   Medication Sig Start Date End Date Taking? Authorizing Provider  albuterol (PROVENTIL HFA;VENTOLIN HFA) 108 (90 BASE) MCG/ACT inhaler Inhale 2 puffs into the lungs every 6 (six) hours as needed for wheezing or shortness of breath. 09/19/14   Tysinger, Kermit Balo, PA-C  albuterol (PROVENTIL) (2.5 MG/3ML) 0.083% nebulizer solution Take 2.5 mg by nebulization every 6 (six) hours as needed for wheezing or shortness of breath.    [provider]  benzonatate (TESSALON) 200 MG capsule Take 1 capsule  (200 mg total) by mouth 3 (three) times daily as needed for cough. 09/19/14   Tysinger, Kermit Balo, PA-C  cyclobenzaprine (FLEXERIL) 10 MG tablet Take 1 tablet (10 mg total) by mouth 2 (two) times daily as needed for muscle spasms. 03/18/18   Luevenia Maxin, Mina A, PA-C  docusate sodium (COLACE) 250 MG capsule Take 1 capsule (250 mg total) by mouth daily. 03/18/18   Fawze, Mina A, PA-C  gabapentin (NEURONTIN) 600 MG tablet Take 600 mg by mouth 3 (three) times daily.    [provider]  ibuprofen (ADVIL,MOTRIN) 800 MG tablet Take 1 tablet (800 mg total) 3 (three) times daily by mouth. 06/08/17   Eber Hong, MD  lidocaine (LIDODERM) 5 % Place 1 patch onto the skin daily. Remove & Discard patch within 12 hours or as directed by MD 03/18/18   Michela Pitcher A, PA-C  morphine (MSIR) 15 MG tablet Take 1 tablet (15 mg total) by mouth every 6 (six) hours as needed for severe pain. 03/18/18   Luevenia Maxin, Mina A, PA-C  naproxen (NAPROSYN) 500 MG tablet Take 1 tablet (500 mg total) by mouth 2 (two) times daily with a meal. 10/11/16   Georgiana Shore, PA-C    Family History Family History  Problem Relation Age of Onset  . Kidney disease Mother        dialysis  . Diabetes  Mother   . Ulcers Father   . Heart disease Maternal Grandmother   . Other Maternal Grandfather        old age  . Dementia Paternal Grandmother   . Cancer Other        breast  . Hypertension Neg Hx   . Hyperlipidemia Neg Hx   . Stroke Neg Hx     Social History Social History   Tobacco Use  . Smoking status: Never Smoker  . Smokeless tobacco: Never Used  Substance Use Topics  . Alcohol use: Yes    Comment: social  . Drug use: No     Allergies   Patient has no known allergies.   Review of Systems Review of Systems  Constitutional: Negative for chills and fever.  Respiratory: Negative for shortness of breath.   Cardiovascular: Negative for chest pain.  Gastrointestinal: Positive for abdominal pain, constipation and nausea.  Negative for vomiting.  Genitourinary: Positive for decreased urine volume, difficulty urinating and dysuria. Negative for frequency and urgency.       Incontinence  Musculoskeletal: Positive for back pain. Negative for neck pain.  Neurological: Positive for weakness and numbness (Genital area).  All other systems reviewed and are negative.    Physical Exam Updated Vital Signs BP (!) 143/85   Pulse 90   Temp 98.6 F (37 C) (Oral)   Resp 16   Ht 5' 5.5" (1.664 m)   Wt 106.1 kg   LMP 04/15/2018   SpO2 99%   BMI 38.35 kg/m   Physical Exam  Constitutional: She appears well-developed and well-nourished. She appears distressed.  Uncomfortable  HENT:  Head: Normocephalic and atraumatic.  Eyes: Conjunctivae are normal.  Neck: Neck supple.  Cardiovascular: Regular rhythm. Tachycardia present.  No murmur heard. Pulmonary/Chest: Effort normal and breath sounds normal. No respiratory distress.  Abdominal: Soft. Bowel sounds are normal. She exhibits no distension and no mass. There is tenderness (Left lower quadrant). There is no guarding.  Genitourinary:  Genitourinary Comments: Exam performed with chaperone in room.  There is no rectal tone.  There is stool palpable at the tip of finger but none that can be easily removed.   Musculoskeletal: She exhibits no edema or deformity.  Diffuse TTP over lower back.  Neurological: She is alert.  Skin: Skin is warm and dry. She is not diaphoretic.  Psychiatric: She has a normal mood and affect.  Nursing note and vitals reviewed.    ED Treatments / Results  Labs (all labs ordered are listed, but only abnormal results are displayed) Labs Reviewed  COMPREHENSIVE METABOLIC PANEL - Abnormal; Notable for the following components:      Result Value   Glucose, Bld 174 (*)    Creatinine, Ser 1.11 (*)    All other components within normal limits  URINE CULTURE  URINALYSIS, ROUTINE W REFLEX MICROSCOPIC  LIPASE, BLOOD  CBC WITH  DIFFERENTIAL/PLATELET  PREGNANCY, URINE    EKG None  Radiology No results found.  Procedures Procedures (including critical care time) CRITICAL CARE Performed by: Lyndel Safe Total critical care time: 30 minutes Critical care time was exclusive of separately billable procedures and treating other patients. Critical care was necessary to treat or prevent imminent or life-threatening deterioration. Critical care was time spent personally by me on the following activities: development of treatment plan with patient and/or surrogate as well as nursing, discussions with consultants, evaluation of patient's response to treatment, examination of patient, obtaining history from patient or surrogate, ordering and performing treatments  and interventions, ordering and review of laboratory studies, ordering and review of radiographic studies, pulse oximetry and re-evaluation of patient's condition.  Suspected cauda-equina/spinal cord compression requiring emergent transfer for MRI and evaluation.   Medications Ordered in ED Medications  HYDROmorphone (DILAUDID) injection 1 mg (1 mg Intravenous Given 05/01/18 2241)     Initial Impression / Assessment and Plan / ED Course  I have reviewed the triage vital signs and the nursing notes.  Pertinent labs & imaging results that were available during my care of the patient were reviewed by me and considered in my medical decision making (see chart for details).  Clinical Course as of May 02 6  Caleen Essex May 01, 2018  2222 Rectal exam performed with nurse in room, no rectal tone.  Bladder scan performed with Tresa Endo RN, over 900 mL's in bladder.  Order for Foley placed.   [EH]  2309 Patient reevaluated, she has had 1400 milliliters of urine out through her Foley since placement.  Her abdominal pain is resolved.  She is now nontender to palpation in her entire anterior abdomen.   [EH]    Clinical Course User Index [EH] Cristina Gong, PA-C    Patient presents today for evaluation of back pain, abdominal pain and inability to feel herself urinating.  On exam her lower abdomen was initially markedly tender.  Bladder scan was ordered and performed and she had approximately 1300 mL of output by Foley catheter.  Rectal exam was performed and she had low/no rectal tone.  After Foley catheter placement her abdominal pain was significantly improved   Her pain was treated with dilaudid.    She has a MRI from 03/21/2018 which showed a lumbar disc bulge without evidence of cord compression.  Given urinary retention and no rectal tone concern for cauda equina.    No MRI availability at [facility].  Patient will be transferred to [facility] for MRI and access to neurosurgery if needed.  I spoke with Dr. Judd Lien.  Patient will be transferred to [facility].    Final Clinical Impressions(s) / ED Diagnoses   Final diagnoses:  Urinary retention  Decreased rectal sphincter tone    ED Discharge Orders    None       Cristina Gong, PA-C 05/02/18 0011    Gwyneth Sprout, MD 05/02/18 1511

## 2018-05-01 NOTE — ED Triage Notes (Signed)
Back pain. States is constipated due to pain medications. She was seen by her MD yesterday due to urinary incontinence. She feels dehydrated. Unable to eat.

## 2018-05-02 ENCOUNTER — Emergency Department (HOSPITAL_COMMUNITY): Payer: 59 | Admitting: Certified Registered"

## 2018-05-02 ENCOUNTER — Emergency Department (HOSPITAL_COMMUNITY): Payer: 59

## 2018-05-02 ENCOUNTER — Ambulatory Visit (HOSPITAL_COMMUNITY)
Admission: EM | Disposition: A | Discharge status: Home / Self Care | Payer: Self-pay | Source: Home / Self Care | Attending: Neurosurgery

## 2018-05-02 DIAGNOSIS — G834 Cauda equina syndrome: Secondary | ICD-10-CM | POA: Diagnosis present

## 2018-05-02 DIAGNOSIS — Z9889 Other specified postprocedural states: Secondary | ICD-10-CM

## 2018-05-02 HISTORY — PX: LUMBAR LAMINECTOMY/DECOMPRESSION MICRODISCECTOMY: SHX5026

## 2018-05-02 LAB — GLUCOSE, CAPILLARY
Glucose-Capillary: 101 mg/dL — ABNORMAL HIGH (ref 70–99)
Glucose-Capillary: 114 mg/dL — ABNORMAL HIGH (ref 70–99)
Glucose-Capillary: 180 mg/dL — ABNORMAL HIGH (ref 70–99)
Glucose-Capillary: 216 mg/dL — ABNORMAL HIGH (ref 70–99)
Glucose-Capillary: 102 mg/dL — ABNORMAL HIGH (ref 70–99)

## 2018-05-02 SURGERY — LUMBAR LAMINECTOMY/DECOMPRESSION MICRODISCECTOMY 2 LEVELS
Anesthesia: General | Site: Back | Laterality: Left

## 2018-05-02 MED ORDER — MIDAZOLAM HCL 2 MG/2ML IJ SOLN
INTRAMUSCULAR | Status: AC
  Filled 2018-05-02: qty 2

## 2018-05-02 MED ORDER — GABAPENTIN 600 MG PO TABS
600.0000 mg | ORAL_TABLET | Freq: Three times a day (TID) | ORAL | Status: DC
  Administered 2018-05-02 – 2018-05-03 (×4): 600 mg via ORAL
  Filled 2018-05-02 (×4): qty 1

## 2018-05-02 MED ORDER — KETOROLAC TROMETHAMINE 15 MG/ML IJ SOLN
30.0000 mg | Freq: Four times a day (QID) | INTRAMUSCULAR | Status: AC
  Administered 2018-05-03 (×2): 30 mg via INTRAVENOUS
  Filled 2018-05-02 (×3): qty 2

## 2018-05-02 MED ORDER — SODIUM CHLORIDE 0.9% FLUSH: 3.0000 mL | INTRAVENOUS | Status: DC | PRN

## 2018-05-02 MED ORDER — CEFAZOLIN SODIUM-DEXTROSE 2-4 GM/100ML-% IV SOLN
2.0000 g | INTRAVENOUS | Status: AC
  Administered 2018-05-02: 2 g via INTRAVENOUS
  Filled 2018-05-02: qty 100

## 2018-05-02 MED ORDER — KETOROLAC TROMETHAMINE 30 MG/ML IJ SOLN
INTRAMUSCULAR | Status: DC | PRN
  Administered 2018-05-02: 30 mg via INTRAVENOUS

## 2018-05-02 MED ORDER — SUCCINYLCHOLINE CHLORIDE 200 MG/10ML IV SOSY
PREFILLED_SYRINGE | INTRAVENOUS | Status: AC
  Filled 2018-05-02: qty 10

## 2018-05-02 MED ORDER — SODIUM CHLORIDE 0.9% FLUSH
3.0000 mL | Freq: Two times a day (BID) | INTRAVENOUS | Status: DC
  Administered 2018-05-02 (×2): 3 mL via INTRAVENOUS

## 2018-05-02 MED ORDER — MIDAZOLAM HCL 2 MG/2ML IJ SOLN
INTRAMUSCULAR | Status: DC | PRN
  Administered 2018-05-02: 2 mg via INTRAVENOUS

## 2018-05-02 MED ORDER — THROMBIN 5000 UNITS EX SOLR
CUTANEOUS | Status: AC
  Filled 2018-05-02: qty 10000

## 2018-05-02 MED ORDER — OXYCODONE HCL 5 MG PO TABS: 5.0000 mg | ORAL_TABLET | Freq: Once | ORAL | Status: DC | PRN

## 2018-05-02 MED ORDER — DEXAMETHASONE SODIUM PHOSPHATE 10 MG/ML IJ SOLN
INTRAMUSCULAR | Status: DC | PRN
  Administered 2018-05-02: 5 mg via INTRAVENOUS

## 2018-05-02 MED ORDER — ONDANSETRON HCL 4 MG/2ML IJ SOLN
INTRAMUSCULAR | Status: AC
  Filled 2018-05-02: qty 2

## 2018-05-02 MED ORDER — HYDROMORPHONE HCL 1 MG/ML IJ SOLN: 1.0000 mg | INTRAMUSCULAR | Status: DC | PRN

## 2018-05-02 MED ORDER — ROCURONIUM BROMIDE 100 MG/10ML IV SOLN
INTRAVENOUS | Status: DC | PRN
  Administered 2018-05-02: 50 mg via INTRAVENOUS
  Administered 2018-05-02: 10 mg via INTRAVENOUS

## 2018-05-02 MED ORDER — CIPROFLOXACIN HCL 500 MG PO TABS
500.0000 mg | ORAL_TABLET | Freq: Two times a day (BID) | ORAL | Status: DC
  Administered 2018-05-02 – 2018-05-03 (×3): 500 mg via ORAL
  Filled 2018-05-02 (×3): qty 1

## 2018-05-02 MED ORDER — SODIUM CHLORIDE 0.9 % IV SOLN
INTRAVENOUS | Status: DC | PRN
  Administered 2018-05-02 (×2): via INTRAVENOUS

## 2018-05-02 MED ORDER — PHENYLEPHRINE HCL 10 MG/ML IJ SOLN
INTRAMUSCULAR | Status: DC | PRN
  Administered 2018-05-02: 80 ug via INTRAVENOUS
  Administered 2018-05-02: 120 ug via INTRAVENOUS
  Administered 2018-05-02 (×2): 40 ug via INTRAVENOUS

## 2018-05-02 MED ORDER — SODIUM CHLORIDE 0.9 % IV SOLN
250.0000 mL | INTRAVENOUS | Status: DC
  Administered 2018-05-02 – 2018-05-03 (×2): 250 mL via INTRAVENOUS

## 2018-05-02 MED ORDER — INSULIN ASPART 100 UNIT/ML ~~LOC~~ SOLN
0.0000 [IU] | Freq: Three times a day (TID) | SUBCUTANEOUS | Status: DC
  Administered 2018-05-02: 3 [IU] via SUBCUTANEOUS
  Administered 2018-05-02: 7 [IU] via SUBCUTANEOUS

## 2018-05-02 MED ORDER — BUPIVACAINE HCL (PF) 0.25 % IJ SOLN
INTRAMUSCULAR | Status: AC
  Filled 2018-05-02: qty 30

## 2018-05-02 MED ORDER — PROPOFOL 10 MG/ML IV BOLUS
INTRAVENOUS | Status: AC
  Filled 2018-05-02: qty 20

## 2018-05-02 MED ORDER — ROCURONIUM BROMIDE 50 MG/5ML IV SOSY
PREFILLED_SYRINGE | INTRAVENOUS | Status: AC
  Filled 2018-05-02: qty 5

## 2018-05-02 MED ORDER — SODIUM CHLORIDE 0.9 % IV SOLN
INTRAVENOUS | Status: DC | PRN
  Administered 2018-05-02: 40 ug/min via INTRAVENOUS

## 2018-05-02 MED ORDER — FENTANYL CITRATE (PF) 250 MCG/5ML IJ SOLN
INTRAMUSCULAR | Status: DC | PRN
  Administered 2018-05-02: 50 ug via INTRAVENOUS
  Administered 2018-05-02: 100 ug via INTRAVENOUS

## 2018-05-02 MED ORDER — SODIUM CHLORIDE 0.9 % IV SOLN
INTRAVENOUS | Status: DC | PRN
  Administered 2018-05-02: 500 mL

## 2018-05-02 MED ORDER — ACETAMINOPHEN 650 MG RE SUPP: 650.0000 mg | RECTAL | Status: DC | PRN

## 2018-05-02 MED ORDER — INSULIN DETEMIR 100 UNIT/ML ~~LOC~~ SOLN
10.0000 [IU] | Freq: Every day | SUBCUTANEOUS | Status: DC
  Administered 2018-05-02 – 2018-05-03 (×2): 10 [IU] via SUBCUTANEOUS
  Filled 2018-05-02 (×2): qty 0.1

## 2018-05-02 MED ORDER — METFORMIN HCL 500 MG PO TABS
500.0000 mg | ORAL_TABLET | Freq: Two times a day (BID) | ORAL | Status: DC
  Administered 2018-05-02 – 2018-05-03 (×2): 500 mg via ORAL
  Filled 2018-05-02 (×2): qty 1

## 2018-05-02 MED ORDER — BUPIVACAINE HCL (PF) 0.25 % IJ SOLN
INTRAMUSCULAR | Status: DC | PRN
  Administered 2018-05-02: 20 mL

## 2018-05-02 MED ORDER — PHENOL 1.4 % MT LIQD: 1.0000 | OROMUCOSAL | Status: DC | PRN

## 2018-05-02 MED ORDER — LIDOCAINE 2% (20 MG/ML) 5 ML SYRINGE
INTRAMUSCULAR | Status: AC
  Filled 2018-05-02: qty 5

## 2018-05-02 MED ORDER — THROMBIN (RECOMBINANT) 20000 UNITS EX SOLR
CUTANEOUS | Status: AC
  Filled 2018-05-02: qty 20000

## 2018-05-02 MED ORDER — FENTANYL CITRATE (PF) 100 MCG/2ML IJ SOLN: 25.0000 ug | INTRAMUSCULAR | Status: DC | PRN

## 2018-05-02 MED ORDER — PHENYLEPHRINE HCL 10 MG/ML IJ SOLN
INTRAMUSCULAR | Status: AC
  Filled 2018-05-02: qty 2

## 2018-05-02 MED ORDER — MENTHOL 3 MG MT LOZG: 1.0000 | LOZENGE | OROMUCOSAL | Status: DC | PRN

## 2018-05-02 MED ORDER — ACETAMINOPHEN 325 MG PO TABS: 650.0000 mg | ORAL_TABLET | ORAL | Status: DC | PRN

## 2018-05-02 MED ORDER — ONDANSETRON HCL 4 MG PO TABS: 4.0000 mg | ORAL_TABLET | Freq: Four times a day (QID) | ORAL | Status: DC | PRN

## 2018-05-02 MED ORDER — ONDANSETRON HCL 4 MG/2ML IJ SOLN: 4.0000 mg | Freq: Four times a day (QID) | INTRAMUSCULAR | Status: DC | PRN

## 2018-05-02 MED ORDER — HYDROCODONE-ACETAMINOPHEN 5-325 MG PO TABS: 1.0000 | ORAL_TABLET | ORAL | Status: DC | PRN

## 2018-05-02 MED ORDER — SUGAMMADEX SODIUM 200 MG/2ML IV SOLN
INTRAVENOUS | Status: DC | PRN
  Administered 2018-05-02: 200 mg via INTRAVENOUS

## 2018-05-02 MED ORDER — LIDOCAINE HCL (CARDIAC) PF 100 MG/5ML IV SOSY
PREFILLED_SYRINGE | INTRAVENOUS | Status: DC | PRN
  Administered 2018-05-02: 60 mg via INTRATRACHEAL

## 2018-05-02 MED ORDER — CEFAZOLIN SODIUM-DEXTROSE 1-4 GM/50ML-% IV SOLN
1.0000 g | Freq: Three times a day (TID) | INTRAVENOUS | Status: AC
  Administered 2018-05-02 (×2): 1 g via INTRAVENOUS
  Filled 2018-05-02 (×2): qty 50

## 2018-05-02 MED ORDER — HYDROCODONE-ACETAMINOPHEN 10-325 MG PO TABS: 2.0000 | ORAL_TABLET | ORAL | Status: DC | PRN

## 2018-05-02 MED ORDER — ACETAMINOPHEN 10 MG/ML IV SOLN: 1000.0000 mg | Freq: Once | INTRAVENOUS | Status: DC | PRN

## 2018-05-02 MED ORDER — ACETAMINOPHEN 500 MG PO TABS: 1000.0000 mg | ORAL_TABLET | Freq: Once | ORAL | Status: DC | PRN

## 2018-05-02 MED ORDER — THROMBIN 20000 UNITS EX SOLR
CUTANEOUS | Status: DC | PRN
  Administered 2018-05-02: 20 mL via TOPICAL

## 2018-05-02 MED ORDER — 0.9 % SODIUM CHLORIDE (POUR BTL) OPTIME
TOPICAL | Status: DC | PRN
  Administered 2018-05-02: 1000 mL

## 2018-05-02 MED ORDER — CYCLOBENZAPRINE HCL 10 MG PO TABS: 10.0000 mg | ORAL_TABLET | Freq: Three times a day (TID) | ORAL | Status: DC | PRN

## 2018-05-02 MED ORDER — DEXAMETHASONE SODIUM PHOSPHATE 10 MG/ML IJ SOLN
INTRAMUSCULAR | Status: AC
  Filled 2018-05-02: qty 1

## 2018-05-02 MED ORDER — MORPHINE SULFATE 15 MG PO TABS: 15.0000 mg | ORAL_TABLET | Freq: Four times a day (QID) | ORAL | Status: DC | PRN

## 2018-05-02 MED ORDER — OXYCODONE HCL 5 MG/5ML PO SOLN: 5.0000 mg | Freq: Once | ORAL | Status: DC | PRN

## 2018-05-02 MED ORDER — ACETAMINOPHEN 160 MG/5ML PO SOLN: 1000.0000 mg | Freq: Once | ORAL | Status: DC | PRN

## 2018-05-02 MED ORDER — FENTANYL CITRATE (PF) 250 MCG/5ML IJ SOLN
INTRAMUSCULAR | Status: AC
  Filled 2018-05-02: qty 5

## 2018-05-02 MED ORDER — PROPOFOL 10 MG/ML IV BOLUS
INTRAVENOUS | Status: DC | PRN
  Administered 2018-05-02: 140 mg via INTRAVENOUS

## 2018-05-02 MED ORDER — ONDANSETRON HCL 4 MG/2ML IJ SOLN
INTRAMUSCULAR | Status: DC | PRN
  Administered 2018-05-02: 4 mg via INTRAVENOUS

## 2018-05-02 SURGICAL SUPPLY — 55 items
BAG DECANTER FOR FLEXI CONT (MISCELLANEOUS) ×3 IMPLANT
BENZOIN TINCTURE PRP APPL 2/3 (GAUZE/BANDAGES/DRESSINGS) ×3 IMPLANT
BLADE CLIPPER SURG (BLADE) IMPLANT
BUR CUTTER 7.0 ROUND (BURR) ×3 IMPLANT
CANISTER SUCT 3000ML PPV (MISCELLANEOUS) ×3 IMPLANT
CARTRIDGE OIL MAESTRO DRILL (MISCELLANEOUS) ×1 IMPLANT
CLOSURE STERI-STRIP 1/2X4 (GAUZE/BANDAGES/DRESSINGS) ×1
CLOSURE WOUND 1/2 X4 (GAUZE/BANDAGES/DRESSINGS) ×1
CLSR STERI-STRIP ANTIMIC 1/2X4 (GAUZE/BANDAGES/DRESSINGS) ×2 IMPLANT
CONT SPEC STER OR (MISCELLANEOUS) ×6 IMPLANT
COVER WAND RF STERILE (DRAPES) ×3 IMPLANT
DECANTER SPIKE VIAL GLASS SM (MISCELLANEOUS) ×3 IMPLANT
DERMABOND ADVANCED (GAUZE/BANDAGES/DRESSINGS) ×2
DERMABOND ADVANCED .7 DNX12 (GAUZE/BANDAGES/DRESSINGS) ×1 IMPLANT
DIFFUSER DRILL AIR PNEUMATIC (MISCELLANEOUS) ×3 IMPLANT
DRAPE HALF SHEET 40X57 (DRAPES) IMPLANT
DRAPE LAPAROTOMY 100X72X124 (DRAPES) ×3 IMPLANT
DRAPE MICROSCOPE LEICA (MISCELLANEOUS) ×3 IMPLANT
DRAPE SURG 17X23 STRL (DRAPES) ×6 IMPLANT
DRSG OPSITE POSTOP 4X6 (GAUZE/BANDAGES/DRESSINGS) ×3 IMPLANT
DURAPREP 26ML APPLICATOR (WOUND CARE) ×3 IMPLANT
ELECT REM PT RETURN 9FT ADLT (ELECTROSURGICAL) ×3
ELECTRODE REM PT RTRN 9FT ADLT (ELECTROSURGICAL) ×1 IMPLANT
GAUZE 4X4 16PLY RFD (DISPOSABLE) IMPLANT
GAUZE SPONGE 4X4 12PLY STRL (GAUZE/BANDAGES/DRESSINGS) ×3 IMPLANT
GLOVE BIO SURGEON STRL SZ 6.5 (GLOVE) ×6 IMPLANT
GLOVE BIO SURGEON STRL SZ7 (GLOVE) ×3 IMPLANT
GLOVE BIO SURGEONS STRL SZ 6.5 (GLOVE) ×3
GLOVE BIOGEL PI IND STRL 6.5 (GLOVE) ×2 IMPLANT
GLOVE BIOGEL PI INDICATOR 6.5 (GLOVE) ×4
GLOVE ECLIPSE 9.0 STRL (GLOVE) ×3 IMPLANT
GLOVE EXAM NITRILE LRG STRL (GLOVE) IMPLANT
GLOVE EXAM NITRILE XL STR (GLOVE) IMPLANT
GLOVE EXAM NITRILE XS STR PU (GLOVE) IMPLANT
GOWN STRL REUS W/ TWL LRG LVL3 (GOWN DISPOSABLE) ×2 IMPLANT
GOWN STRL REUS W/ TWL XL LVL3 (GOWN DISPOSABLE) ×1 IMPLANT
GOWN STRL REUS W/TWL 2XL LVL3 (GOWN DISPOSABLE) IMPLANT
GOWN STRL REUS W/TWL LRG LVL3 (GOWN DISPOSABLE) ×4
GOWN STRL REUS W/TWL XL LVL3 (GOWN DISPOSABLE) ×2
KIT BASIN OR (CUSTOM PROCEDURE TRAY) ×3 IMPLANT
KIT TURNOVER KIT B (KITS) ×3 IMPLANT
NEEDLE HYPO 22GX1.5 SAFETY (NEEDLE) ×3 IMPLANT
NEEDLE SPNL 22GX3.5 QUINCKE BK (NEEDLE) IMPLANT
NS IRRIG 1000ML POUR BTL (IV SOLUTION) ×3 IMPLANT
OIL CARTRIDGE MAESTRO DRILL (MISCELLANEOUS) ×3
PACK LAMINECTOMY NEURO (CUSTOM PROCEDURE TRAY) ×3 IMPLANT
PAD ARMBOARD 7.5X6 YLW CONV (MISCELLANEOUS) ×9 IMPLANT
RUBBERBAND STERILE (MISCELLANEOUS) ×9 IMPLANT
SPONGE SURGIFOAM ABS GEL SZ50 (HEMOSTASIS) ×3 IMPLANT
STRIP CLOSURE SKIN 1/2X4 (GAUZE/BANDAGES/DRESSINGS) ×2 IMPLANT
SUT VIC AB 2-0 CT1 18 (SUTURE) ×3 IMPLANT
SUT VIC AB 3-0 SH 8-18 (SUTURE) ×3 IMPLANT
TOWEL GREEN STERILE (TOWEL DISPOSABLE) ×3 IMPLANT
TOWEL GREEN STERILE FF (TOWEL DISPOSABLE) ×3 IMPLANT
WATER STERILE IRR 1000ML POUR (IV SOLUTION) ×3 IMPLANT

## 2018-05-02 NOTE — ED Notes (Signed)
Arrived to ED via [facility] from [facility] for MRI. Pt alert and oriented, denies pain at this time. Foley intact and draining.

## 2018-05-02 NOTE — ED Notes (Signed)
Report given to [facility] with [facility].

## 2018-05-02 NOTE — Op Note (Signed)
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  Asst. Surgeon: Doran Durand, NP  Anesthesia: General  Indication: 37 year old female with history of known L5-S1 disc herniation presents with severe continuing back and radicular pain with new onset of bowel and bladder dysfunction for the past 72hours.  Follow-up MRI scanning demonstrates evidence of some progression of her disc herniation with very severe cauda equina compression at L5-S1 and almost complete obliteration of the canal at this level.  Patient is taken emergently to the operating room for decompression of her thecal sac and nerve roots in hopes of improving her symptoms.  Operative note: After induction anesthesia, patient is position prone onto Wilson frame and properly padded.  Lumbar region prepped and draped sterilely.  Incision made overlying L5-S1.  Dissection performed on the left.  Retractor placed.  X-ray taken.  Level confirmed.  Laminotomy then performed using high-speed drill and Kerrison Rogers.  Ligament flavum elevated and resected and the underlying thecal sac and left S1 nerve root identified.  Microscope was then brought to fill these out the remainder of the discectomy for microdissection spinal canal.  Thecal sac and S1 nerve root gently mobilized and retracted.  Disc herniation was dissected free.  Disc herniation was then removed in multiple very large pieces.  The space was entered.  The space was cleaned of all loose or obviously degenerative disc material.  The disc herniation was also removed from within the axilla of the S1 nerve root.  At this point a very thorough discectomy had been performed.  There was no evidence of injury to thecal sac or nerve  roots.  Wound was then irrigated fanlike solution.  Gelfoam was placed topically for hemostasis.  Wounds and closed in layers with Vicryl sutures.  Steri-Strips and sterile dressing were applied.  No apparent complications.  Patient tolerated the procedure well and she returns to the recovery room postop.

## 2018-05-02 NOTE — Anesthesia Preprocedure Evaluation (Addendum)
Anesthesia Evaluation  Patient identified by MRN, date of birth, ID band Patient awake    Reviewed: Allergy & Precautions, NPO status , Patient's Chart, lab work & pertinent test results  History of Anesthesia Complications Negative for: history of anesthetic complications  Airway Mallampati: II  TM Distance: >3 FB Neck ROM: Full    Dental  (+) Teeth Intact   Pulmonary    breath sounds clear to auscultation       Cardiovascular negative cardio ROS   Rhythm:Regular     Neuro/Psych Herniated disc with neurologic symptoms  Neuromuscular disease negative psych ROS   GI/Hepatic negative GI ROS, Neg liver ROS,   Endo/Other  diabetesMorbid obesity  Renal/GU negative Renal ROS     Musculoskeletal   Abdominal   Peds  Hematology negative hematology ROS (+)   Anesthesia Other Findings Home opioids for herniated disk   Reproductive/Obstetrics                            Anesthesia Physical Anesthesia Plan  ASA: II  Anesthesia Plan: General   Post-op Pain Management:    Induction: Intravenous  PONV Risk Score and Plan: 3 and Ondansetron and Dexamethasone  Airway Management Planned: Oral ETT  Additional Equipment: None  Intra-op Plan:   Post-operative Plan: Extubation in OR  Informed Consent: I have reviewed the patients History and Physical, chart, labs and discussed the procedure including the risks, benefits and alternatives for the proposed anesthesia with the patient or authorized representative who has indicated his/her understanding and acceptance.   Dental advisory given  Plan Discussed with: CRNA and Surgeon  Anesthesia Plan Comments:         Anesthesia Quick Evaluation

## 2018-05-02 NOTE — ED Notes (Signed)
Taken to MRI at this time

## 2018-05-02 NOTE — H&P (Signed)
Amy Matthews is an 37 y.o. female.   Chief Complaint: Loss of bladder control HPI: 37 year old female with known lumbar disc herniation presents with worsening of symptoms.  Patient with back pain and bilateral lower extremity radicular pain which has been ongoing for the past few months but is gotten much worse over the past few days.  Patient now with intermittent urinary incontinence and urinary retention.  Patient with bowel incontinence.  Patient with severe loss of sensation in her sacral region.  No history of trauma.  Patient not aware of any definite weakness.  Past Medical History:  Diagnosis Date  . HRP (high risk pregnancy)    prolonged hospitalization, cerclage done  . IUD (intrauterine device) in place 2006   Mirena  . Obesity   . Wears contact lenses     Past Surgical History:  Procedure Laterality Date  . CERVICAL CERCLAGE    . CYST EXCISION  2004   under right breast  . DILATION AND CURETTAGE OF UTERUS  2010   due to miscarriage    Family History  Problem Relation Age of Onset  . Kidney disease Mother        dialysis  . Diabetes Mother   . Ulcers Father   . Heart disease Maternal Grandmother   . Other Maternal Grandfather        old age  . Dementia Paternal Grandmother   . Cancer Other        breast  . Hypertension Neg Hx   . Hyperlipidemia Neg Hx   . Stroke Neg Hx    Social History:  reports that she has never smoked. She has never used smokeless tobacco. She reports that she drinks alcohol. She reports that she does not use drugs.  Allergies: No Known Allergies   (Not in a hospital admission)  Results for orders placed or performed during the hospital encounter of 05/01/18 (from the past 48 hour(s))  Urinalysis, Routine w reflex microscopic     Status: None   Collection Time: 05/01/18 10:40 PM  Result Value Ref Range   Color, Urine YELLOW YELLOW   APPearance CLEAR CLEAR   Specific Gravity, Urine 1.025 1.005 - 1.030   pH 5.5 5.0 - 8.0   Glucose, UA NEGATIVE NEGATIVE mg/dL   Hgb urine dipstick NEGATIVE NEGATIVE   Bilirubin Urine NEGATIVE NEGATIVE   Ketones, ur NEGATIVE NEGATIVE mg/dL   Protein, ur NEGATIVE NEGATIVE mg/dL   Nitrite NEGATIVE NEGATIVE   Leukocytes, UA NEGATIVE NEGATIVE    Comment: Microscopic not done on urines with negative protein, blood, leukocytes, nitrite, or glucose < 500 mg/dL. Performed at [facility], [address] [place], [place] [address]   Comprehensive metabolic panel     Status: Abnormal   Collection Time: 05/01/18 10:40 PM  Result Value Ref Range   Sodium 136 135 - 145 mmol/L   Potassium 4.0 3.5 - 5.1 mmol/L   Chloride 103 98 - 111 mmol/L   CO2 23 22 - 32 mmol/L   Glucose, Bld 174 (H) 70 - 99 mg/dL   BUN 14 6 - 20 mg/dL   Creatinine, Ser 1.11 (H) 0.44 - 1.00 mg/dL   Calcium 9.2 8.9 - 10.3 mg/dL   Total Protein 7.6 6.5 - 8.1 g/dL   Albumin 3.8 3.5 - 5.0 g/dL   AST 16 15 - 41 U/L   ALT 18 0 - 44 U/L   Alkaline Phosphatase 41 38 - 126 U/L   Total Bilirubin 0.9 0.3 -  1.2 mg/dL   GFR calc non Af Amer >60 >60 mL/min   GFR calc Af Amer >60 >60 mL/min    Comment: (NOTE) The eGFR has been calculated using the CKD EPI equation. This calculation has not been validated in all clinical situations. eGFR's persistently <60 mL/min signify possible Chronic Kidney Disease.    Anion gap 10 5 - 15    Comment: Performed at [facility], [address] [place], [place] [address]  Lipase, blood     Status: None   Collection Time: 05/01/18 10:40 PM  Result Value Ref Range   Lipase 21 11 - 51 U/L    Comment: Performed at [facility], [address] [place], [place] [address]  CBC with Differential     Status: None   Collection Time: 05/01/18 10:40 PM  Result Value Ref Range   WBC 6.4 4.0 - 10.5 K/uL   RBC 4.26 3.87 - 5.11 MIL/uL   Hemoglobin 13.0 12.0 - 15.0 g/dL   HCT 38.4 36.0 - 46.0 %   MCV 90.1 78.0 - 100.0 fL   MCH 30.5 26.0 - 34.0 pg   MCHC 33.9 30.0  - 36.0 g/dL   RDW 11.9 11.5 - 15.5 %   Platelets 236 150 - 400 K/uL   Neutrophils Relative % 76 %   Neutro Abs 4.9 1.7 - 7.7 K/uL   Lymphocytes Relative 12 %   Lymphs Abs 0.8 0.7 - 4.0 K/uL   Monocytes Relative 9 %   Monocytes Absolute 0.6 0.1 - 1.0 K/uL   Eosinophils Relative 3 %   Eosinophils Absolute 0.2 0.0 - 0.7 K/uL   Basophils Relative 0 %   Basophils Absolute 0.0 0.0 - 0.1 K/uL    Comment: Performed at [facility], [address] [place], [place] [address]  Pregnancy, urine     Status: None   Collection Time: 05/01/18 10:40 PM  Result Value Ref Range   Preg Test, Ur NEGATIVE NEGATIVE    Comment:        THE SENSITIVITY OF THIS METHODOLOGY IS >20 mIU/mL. Performed at [facility], [address] [place], [place] [address]    Mr Lumbar Spine Wo Contrast  Result Date: 05/02/2018 CLINICAL DATA:  37 y/o F; loss of bladder function. No known injury. EXAM: MRI LUMBAR SPINE WITHOUT CONTRAST TECHNIQUE: Multiplanar, multisequence MR imaging of the lumbar spine was performed. No intravenous contrast was administered. COMPARISON:  None. FINDINGS: Segmentation:  Standard. Alignment:  Physiologic. Vertebrae:  No fracture, evidence of discitis, or bone lesion. Conus medullaris and cauda equina: Conus extends to the upper L2 level. Conus and cauda equina appear normal. Paraspinal and other soft tissues: Negative. Disc levels: L1-2: No significant disc displacement, foraminal stenosis, or canal stenosis. L2-3: No significant disc displacement, foraminal stenosis, or canal stenosis. L3-4: No significant disc displacement, foraminal stenosis, or canal stenosis. L4-5: Small central disc protrusion with annular fissure. No significant foraminal or canal stenosis. L5-S1: Large left subarticular disc extrusion with tenting of the thecal sac and disc material completely effacing the spinal canal resulting in compression of the cauda equina (series 9, image 10, series 10, image 31,  and series 11, image 31). The bilateral S1 nerve roots below the level of the extrusion or enlarged likely representing edema due to mass effect. IMPRESSION: 1. L5-S1 large left subarticular disc extrusion with disc material completely effacing the spinal canal resulting in compression of the cauda equina.  2. L4-5 small central disc protrusion and annular fissure. No foraminal or canal stenosis. 3. Mild enlargement of S1 nerve roots below level of cauda equina compression, likely edema from mass effect. These results were called by telephone at the time of interpretation on 05/02/2018 at 3:16 am to Dr. Veryl Speak , who verbally acknowledged these results. Electronically Signed   By: Kristine Garbe M.D.   On: 05/02/2018 03:19    Pertinent items noted in HPI and remainder of comprehensive ROS otherwise negative.  Blood pressure (!) 147/80, pulse 100, temperature 99.6 F (37.6 C), temperature source Oral, resp. rate 18, height 5' 5.5" (1.664 m), weight 106.1 kg, last menstrual period 04/15/2018, SpO2 99 %.  Patient is awake and alert.  She is oriented and appropriate.  Cranial nerve function is intact.  Speech is fluent.  Judgment and insight are intact.  Motor examination reveals 5/5 motor strength in both lower extremities aside from some slightly diminished toe flexors on the left.  Sensory examination with decreased sensation pinprick and light touch in her left S1 dermatome and her sacral perineal region bilaterally.  Deep tender mixes normal active except her Achilles reflexes are absent bilaterally.  No evidence of long track signs.  Gait not tested.  Examination head ears eyes and throat is unremarkable her chest and abdomen are overweight but otherwise benign.  Extremities are free from injury deformity. 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  Plan emergent decompression by means of a left-sided L5-S1 laminotomy and microdiscectomy.   I discussed the risks and benefits involved with surgery including but not limited to the risk of anesthesia, bleeding, infection, CSF leak, nerve root injury, discrimination, later instability, continued pain, and non-benefit.  The patient has been given the opportunity ask questions.  She appears to understand.  She wishes to proceed with surgery.  Zamoria Boss A Benigno Check 05/02/2018, 4:40 AM

## 2018-05-02 NOTE — ED Notes (Signed)
Cell phone and photo ID given to security.  Medications counted and taken to pharmacy.

## 2018-05-02 NOTE — Evaluation (Signed)
Physical Therapy Evaluation Patient Details Name: Amy Matthews MRN: [id number] DOB: 1981/06/23 Today's Date: 05/02/2018   History of Present Illness  patient is a 37 yo female who presents with neurologic symptoms related to disc herniation, now s/p Left L5-S1 decompressive laminotomy with microdiscectomy  Clinical Impression  Patient seen for mobility assessment s/p spinal surgery. Mobilizing well. Educated patient on precautions, mobility expectations, safety and car transfers. No further acute PT needs. Will sign off.     Follow Up Recommendations No PT follow up    Equipment Recommendations  None recommended by PT    Recommendations for Other Services       Precautions / Restrictions Precautions Precautions: Back Precaution Booklet Issued: Yes (comment) Precaution Comments: verbally reviewed with patient      Mobility  Bed Mobility Overal bed mobility: Modified Independent                Transfers Overall transfer level: Modified independent                  Ambulation/Gait Ambulation/Gait assistance: Modified independent (Device/Increase time) Gait Distance (Feet): 310 Feet Assistive device: IV Pole(IV pole for 150 then no device) Gait Pattern/deviations: WFL(Within Functional Limits)   Gait velocity interpretation: 1.31 - 2.62 ft/sec, indicative of limited community ambulator General Gait Details: steady with ambulation  Stairs Stairs: Yes Stairs assistance: Modified independent (Device/Increase time) Stair Management: One rail Left Number of Stairs: 5 General stair comments: no difficulty  Wheelchair Mobility    Modified Rankin (Stroke Patients Only)       Balance Overall balance assessment: Independent                                           Pertinent Vitals/Pain Pain Assessment: 0-10 Pain Score: 4  Pain Location: operative site Pain Descriptors / Indicators: Sore Pain Intervention(s): Monitored during  session    Home Living Family/patient expects to be discharged to:: Private residence Living Arrangements: Spouse/significant other;Children Available Help at Discharge: Family Type of Home: House Home Access: Stairs to enter Entrance Stairs-Rails: Left Entrance Stairs-Number of Steps: 2 Home Layout: Two level;Able to live on main level with bedroom/bathroom Home Equipment: Shower seat;Cane - single point      Prior Function Level of Independence: Independent               Hand Dominance   Dominant Hand: Right    Extremity/Trunk Assessment   Upper Extremity Assessment Upper Extremity Assessment: Overall WFL for tasks assessed    Lower Extremity Assessment Lower Extremity Assessment: Overall WFL for tasks assessed    Cervical / Trunk Assessment Cervical / Trunk Assessment: (s/p spinal surgery)  Communication   Communication: No difficulties  Cognition Arousal/Alertness: Awake/alert Behavior During Therapy: WFL for tasks assessed/performed Overall Cognitive Status: Within Functional Limits for tasks assessed                                        General Comments      Exercises     Assessment/Plan    PT Assessment Patent does not need any further PT services  PT Problem List         PT Treatment Interventions      PT Goals (Current goals can be found in the Care Plan section)  Acute  Rehab PT Goals PT Goal Formulation: All assessment and education complete, DC therapy    Frequency     Barriers to discharge        Co-evaluation               AM-PAC PT "6 Clicks" Daily Activity  Outcome Measure Difficulty turning over in bed (including adjusting bedclothes, sheets and blankets)?: A Little Difficulty moving from lying on back to sitting on the side of the bed? : None Difficulty sitting down on and standing up from a chair with arms (e.g., wheelchair, bedside commode, etc,.)?: None Help needed moving to and from a bed to  chair (including a wheelchair)?: None Help needed walking in hospital room?: None Help needed climbing 3-5 steps with a railing? : A Little 6 Click Score: 22    End of Session   Activity Tolerance: Patient tolerated treatment well Patient left: in bed;with call bell/phone within reach;with SCD's reapplied Nurse Communication: Mobility status PT Visit Diagnosis: Difficulty in walking, not elsewhere classified (R26.2);Other symptoms and signs involving the nervous system (R29.898)    Time: 4098-1191 PT Time Calculation (min) (ACUTE ONLY): 22 min   Charges:   PT Evaluation $PT Eval Low Complexity: 1 Low          Charlotte Crumb, PT DPT  Board Certified Neurologic Specialist Acute Rehabilitation Services Pager [phone] Office [phone]   Fabio Asa 05/02/2018, 2:25 PM

## 2018-05-02 NOTE — Transfer of Care (Signed)
Immediate Anesthesia Transfer of Care Note  Patient: Amy Matthews  Procedure(s) Performed: LUMBAR LAMINECTOMY/DECOMPRESSION MICRODISCECTOMY, LUMBAR FIVE - SACRAL ONE LEFT (Left Back)  Patient Location: PACU  Anesthesia Type:General  Level of Consciousness: drowsy  Airway & Oxygen Therapy: Patient Spontanous Breathing and Patient connected to nasal cannula oxygen  Post-op Assessment: Report given to RN and Post -op Vital signs reviewed and stable  Post vital signs: Reviewed and stable  Last Vitals:  Vitals Value Taken Time  BP 124/75 05/02/2018  8:36 AM  Temp    Pulse 100 05/02/2018  8:36 AM  Resp 23 05/02/2018  8:36 AM  SpO2 100 % 05/02/2018  8:36 AM  Vitals shown include unvalidated device data.  Last Pain:  Vitals:   05/02/18 0058  TempSrc:   PainSc: 0-No pain         Complications: No apparent anesthesia complications

## 2018-05-02 NOTE — ED Provider Notes (Signed)
Patient transferred from [facility] for MRI of the lumbar spine.  She was diagnosed 1 month ago with a herniated disc and is awaiting neurosurgical consultation.  For the past 3 days, she has noted urinary incontinence.  She was found to be retaining urine and have decreased rectal tone at [facility]  She was then sent here for emergent MRI.  MRI has been performed and reveals an L5-S1 disc extrusion with cauda equina impingement.  This finding has been discussed with Dr. Jordan Likes from neurosurgery.  He will come see the patient and perform surgical decompression.   Geoffery Lyons, MD 05/02/18 [phone]

## 2018-05-02 NOTE — Anesthesia Procedure Notes (Addendum)
Procedure Name: Intubation Date/Time: 05/02/2018 6:20 AM Performed by: Molli Hazard, CRNA Pre-anesthesia Checklist: Patient identified, Emergency Drugs available, Suction available and Patient being monitored Patient Re-evaluated:Patient Re-evaluated prior to induction Oxygen Delivery Method: Circle system utilized Preoxygenation: Pre-oxygenation with 100% oxygen Induction Type: IV induction Ventilation: Mask ventilation without difficulty Laryngoscope Size: Miller and 2 Grade View: Grade I Tube type: Oral Tube size: 7.0 mm Number of attempts: 1 Airway Equipment and Method: Stylet Placement Confirmation: ETT inserted through vocal cords under direct vision,  positive ETCO2 and breath sounds checked- equal and bilateral Secured at: 22 cm Tube secured with: Tape Dental Injury: Teeth and Oropharynx as per pre-operative assessment

## 2018-05-02 NOTE — Brief Op Note (Signed)
05/02/2018  8:28 AM  PATIENT:  Amy Matthews  37 y.o. female  PRE-OPERATIVE DIAGNOSIS:  LUMBAR DISC HERNIATION Lumbar five - Sacral one with cauda equina syndrome  POST-OPERATIVE DIAGNOSIS:  LUMBAR DISC HERNIATION Lumbar five - Sacral one with cauda equina syndrome  PROCEDURE:  Procedure(s): LUMBAR LAMINECTOMY/DECOMPRESSION MICRODISCECTOMY, LUMBAR FIVE - SACRAL ONE LEFT (Left)  SURGEON:  Surgeon(s) and Role:    Julio Sicks, MD - Primary  PHYSICIAN ASSISTANT:   ASSISTANTSMarland Mcalpine   ANESTHESIA:   general  EBL:  125 mL   BLOOD ADMINISTERED:none  DRAINS: none   LOCAL MEDICATIONS USED:  MARCAINE     SPECIMEN:  No Specimen  DISPOSITION OF SPECIMEN:  N/A  COUNTS:  YES  TOURNIQUET:  * No tourniquets in log *  DICTATION: .Dragon Dictation  PLAN OF CARE: Admit to inpatient   PATIENT DISPOSITION:  PACU - hemodynamically stable.   Delay start of Pharmacological VTE agent (>24hrs) due to surgical blood loss or risk of bleeding: yes

## 2018-05-02 NOTE — Anesthesia Postprocedure Evaluation (Signed)
Anesthesia Post Note  Patient: Amy Matthews  Procedure(s) Performed: LUMBAR LAMINECTOMY/DECOMPRESSION MICRODISCECTOMY, LUMBAR FIVE - SACRAL ONE LEFT (Left Back)     Patient location during evaluation: PACU Anesthesia Type: General Level of consciousness: awake and alert Pain management: pain level controlled Vital Signs Assessment: post-procedure vital signs reviewed and stable Respiratory status: spontaneous breathing, nonlabored ventilation, respiratory function stable and patient connected to nasal cannula oxygen Cardiovascular status: blood pressure returned to baseline and stable Postop Assessment: no apparent nausea or vomiting Anesthetic complications: no    Last Vitals:  Vitals:   05/02/18 1623 05/02/18 1958  BP: 125/79 126/83  Pulse: (!) 106 (!) 109  Resp: 20 18  Temp: 36.4 C 36.9 C  SpO2: 98% 96%    Last Pain:  Vitals:   05/02/18 1958  TempSrc: Oral  PainSc:                  Kyleen Villatoro COKER

## 2018-05-03 ENCOUNTER — Encounter (HOSPITAL_COMMUNITY): Payer: Self-pay | Admitting: Neurosurgery

## 2018-05-03 LAB — URINE CULTURE: CULTURE: NO GROWTH

## 2018-05-03 LAB — GLUCOSE, CAPILLARY
GLUCOSE-CAPILLARY: 67 mg/dL — AB (ref 70–99)
GLUCOSE-CAPILLARY: 115 mg/dL — AB (ref 70–99)

## 2018-05-03 MED ORDER — CYCLOBENZAPRINE HCL 10 MG PO TABS: 10.0000 mg | ORAL_TABLET | Freq: Three times a day (TID) | ORAL | 0 refills | Status: DC | PRN

## 2018-05-03 MED ORDER — HYDROCODONE-ACETAMINOPHEN 5-325 MG PO TABS: 1.0000 | ORAL_TABLET | ORAL | 0 refills | Status: DC | PRN

## 2018-05-03 NOTE — Evaluation (Signed)
Occupational Therapy Evaluation Patient Details Name: Amy Matthews MRN: [id number] DOB: [birth year] Today's Date: 05/03/2018    History of Present Illness patient is a 37 yo female who presents with neurologic symptoms related to disc herniation, now s/p Left L5-S1 decompressive laminotomy with microdiscectomy   Clinical Impression   PTA patient independent.  Currently admitted for above and limited by pain and decreased activity tolerance.  Patient demonstrates ability to complete toilet/tub transfers with modified independence, LB ADLs with supervision, and grooming with modified independence.  Educated on precautions, safety, ADL compensatory techniques and energy conservation. Pt plans to use [place] in tub for safety, agreeable to recommendations.  At this time, no further OT needs identified and OT will sign off.  Thank you for this referral.     Follow Up Recommendations  No OT follow up    Equipment Recommendations  None recommended by OT    Recommendations for Other Services       Precautions / Restrictions Precautions Precautions: Back Precaution Booklet Issued: Yes (comment) Precaution Comments: pt able to recall precautions independently Restrictions Weight Bearing Restrictions: No      Mobility Bed Mobility               General bed mobility comments: standing in room upon entry  Transfers Overall transfer level: Modified independent                    Balance Overall balance assessment: Independent                                         ADL either performed or assessed with clinical judgement   ADL Overall ADL's : Needs assistance/impaired     Grooming: Modified independent;Standing;Cueing for compensatory techniques Grooming Details (indicate cue type and reason): educated on compensatory techniques for grooming standing at sink     Lower Body Bathing: Supervison/ safety;Sit to/from stand;Cueing for safety;Cueing for back  precautions Lower Body Bathing Details (indicate cue type and reason): educated on back precautions and techniques for LB bathing seated figure 4 technique      Lower Body Dressing: Supervision/safety;Cueing for back precautions;Cueing for compensatory techniques;Sit to/from stand Lower Body Dressing Details (indicate cue type and reason): educated on back precautions and compensatory techniques completing figure 4 technique Toilet Transfer: Modified Independent       Tub/ Shower Transfer: Modified independent;Tub transfer;Ambulation;Shower Field seismologist Details (indicate cue type and reason): reviewed safety with side stepping over threshold to chair Functional mobility during ADLs: Modified independent General ADL Comments: reviewed body mechanics, precautions and ADL compensatory techniques      Vision Baseline Vision/History: No visual deficits Patient Visual Report: No change from baseline Vision Assessment?: No apparent visual deficits     Perception     Praxis      Pertinent Vitals/Pain Pain Assessment: 0-10 Pain Score: 2  Pain Location: operative site Pain Descriptors / Indicators: Sore Pain Intervention(s): Monitored during session     Hand Dominance Right   Extremity/Trunk Assessment Upper Extremity Assessment Upper Extremity Assessment: Overall WFL for tasks assessed   Lower Extremity Assessment Lower Extremity Assessment: Overall WFL for tasks assessed   Cervical / Trunk Assessment Cervical / Trunk Assessment: Other exceptions Cervical / Trunk Exceptions: s/p spinal surgery   Communication Communication Communication: No difficulties   Cognition Arousal/Alertness: Awake/alert Behavior During Therapy: WFL for tasks assessed/performed Overall Cognitive Status: Within Functional  Limits for tasks assessed                                     General Comments       Exercises     Shoulder Instructions      Home Living  Family/patient expects to be discharged to:: Private residence Living Arrangements: Spouse/significant other;Children Available Help at Discharge: Family Type of Home: House Home Access: Stairs to enter Secretary/administrator of Steps: 2 Entrance Stairs-Rails: Left Home Layout: Two level;Able to live on main level with bedroom/bathroom     Bathroom Shower/Tub: Chief Strategy Officer: Standard     Home Equipment: Production assistant, radio - single point          Prior Functioning/Environment Level of Independence: Independent                 OT Problem List: Decreased knowledge of precautions;Decreased safety awareness;Pain;Decreased knowledge of use of DME or AE      OT Treatment/Interventions:      OT Goals(Current goals can be found in the care plan section) Acute Rehab OT Goals Patient Stated Goal: home today OT Goal Formulation: With patient  OT Frequency:     Barriers to D/C:            Co-evaluation              AM-PAC PT "6 Clicks" Daily Activity     Outcome Measure Help from another person eating meals?: None Help from another person taking care of personal grooming?: None Help from another person toileting, which includes using toliet, bedpan, or urinal?: None Help from another person bathing (including washing, rinsing, drying)?: None Help from another person to put on and taking off regular upper body clothing?: None Help from another person to put on and taking off regular lower body clothing?: None 6 Click Score: 24   End of Session Nurse Communication: Mobility status  Activity Tolerance: Patient tolerated treatment well Patient left: with call bell/phone within reach;with family/visitor present  OT Visit Diagnosis: Pain Pain - part of body: (back incision)                Time: 1610-9604 OT Time Calculation (min): 9 min Charges:  OT General Charges $OT Visit: 1 Visit OT Evaluation $OT Eval Low Complexity: 1 Low  Chancy Milroy, OT Acute Rehabilitation Services Pager [phone] Office [phone]   Chancy Milroy 05/03/2018, 9:35 AM

## 2018-05-03 NOTE — Discharge Summary (Signed)
Physician Discharge Summary  Patient ID: Amy Matthews MRN: [id number] DOB/AGE: 11-07-1980 37 y.o.  Admit date: 05/01/2018 Discharge date: 05/03/2018  Admission Diagnoses:  Discharge Diagnoses:  Active Problems:   S/P lumbar laminectomy   Cauda equina syndrome [facility])   Discharged Condition: good  Hospital Course: Patient admitted to the hospital for treatment of a large paracentral disc herniation L5-S1 with cauda equina syndrome.  Postoperatively she is done very well.  Her preoperative back and lower extremity pain have resolved.  She has much improved sensation in her sacral region.  She is not voided yet but does feel the urge to void and has control of her bowels.  Patient feels comfortable with discharge home provided she is able to void later this morning.  Consults:   Significant Diagnostic Studies:   Treatments:  Discharge Exam: Blood pressure 125/81, pulse 97, temperature 97.6 F (36.4 C), temperature source Oral, resp. rate 18, height 5' 5.5" (1.664 m), weight 106.1 kg, last menstrual period 04/15/2018, SpO2 100 %.  Awake and alert.  Oriented and appropriate.  Speech fluent.  Judgment insight intact.  Motor and sensory examination intact in both upper and lower extremities.  Wound clean and dry.  Chest and abdomen benign. Disposition: Discharge disposition: 01-Home or Self Care        Allergies as of 05/03/2018   No Known Allergies     Medication List    TAKE these medications   albuterol 108 (90 Base) MCG/ACT inhaler Commonly known as:  PROVENTIL HFA;VENTOLIN HFA Inhale 2 puffs into the lungs every 6 (six) hours as needed for wheezing or shortness of breath.   benzonatate 200 MG capsule Commonly known as:  TESSALON Take 1 capsule (200 mg total) by mouth 3 (three) times daily as needed for cough.   ciprofloxacin 500 MG tablet Commonly known as:  CIPRO Take 500 mg by mouth 2 (two) times daily.   cyclobenzaprine 10 MG tablet Commonly known as:   FLEXERIL Take 1 tablet (10 mg total) by mouth 2 (two) times daily as needed for muscle spasms. What changed:  Another medication with the same name was added. Make sure you understand how and when to take each.   cyclobenzaprine 10 MG tablet Commonly known as:  FLEXERIL Take 1 tablet (10 mg total) by mouth 3 (three) times daily as needed for muscle spasms. What changed:  You were already taking a medication with the same name, and this prescription was added. Make sure you understand how and when to take each.   docusate sodium 250 MG capsule Commonly known as:  COLACE Take 1 capsule (250 mg total) by mouth daily.   gabapentin 600 MG tablet Commonly known as:  NEURONTIN Take 600 mg by mouth 3 (three) times daily.   HYDROcodone-acetaminophen 5-325 MG tablet Commonly known as:  NORCO/VICODIN Take 1-2 tablets by mouth every 4 (four) hours as needed for moderate pain ((score 4 to 6)).   ibuprofen 800 MG tablet Commonly known as:  ADVIL,MOTRIN Take 1 tablet (800 mg total) 3 (three) times daily by mouth.   LEVEMIR FLEXTOUCH 100 UNIT/ML Pen Generic drug:  Insulin Detemir Inject 10 Units into the skin daily.   lidocaine 5 % Commonly known as:  LIDODERM Place 1 patch onto the skin daily. Remove & Discard patch within 12 hours or as directed by MD   metFORMIN 500 MG tablet Commonly known as:  GLUCOPHAGE Take 500 mg by mouth 2 (two) times daily.   morphine 15 MG tablet Commonly known  as:  MSIR Take 1 tablet (15 mg total) by mouth every 6 (six) hours as needed for severe pain.   naproxen 500 MG tablet Commonly known as:  NAPROSYN Take 1 tablet (500 mg total) by mouth 2 (two) times daily with a meal.        Signed: Kathaleen Maser Arryn Terrones 05/03/2018, 9:37 AM

## 2018-05-03 NOTE — Progress Notes (Signed)
CBG 67, given orange juice to recheck after 15 min, no signs of hypoglycemia noted, monitoring continues     Leonia Reeves, RN

## 2018-05-03 NOTE — Progress Notes (Signed)
Foley was removed as per Dr's order, patient walked from her room to the nurse's stations and back, tolerated well, no complications, denied any pain, the honeycomb is intact, no drainage noted.

## 2018-05-03 NOTE — Progress Notes (Signed)
Notified Dr. Jordan Likes patient only able to pee 50ml with 452 left in her bladder. He is ok with her leaving today.   Patient discharged in stable condition with all belongings and home medication from main pharmacy. She verbalized understanding of all discharge instructions and importance of follow up visits.

## 2018-05-03 NOTE — Discharge Instructions (Signed)
° °  Acute Urinary Retention, Female Urinary retention means you are unable to pee completely or at all (empty your bladder). Follow these instructions at home:  Drink enough fluids to keep your pee (urine) clear or pale yellow.  If you are sent home with a tube that drains the bladder (catheter), there will be a drainage bag attached to it. There are two types of bags. One is big that you can wear at night without having to empty it. One is smaller and needs to be emptied more often. ? Keep the drainage bag emptied. ? Keep the drainage bag lower than the tube.  Only take medicine as told by your doctor. Contact a doctor if:  You have a low-grade fever.  You have spasms or you are leaking pee when you have spasms. Get help right away if:  You have chills or a fever.  Your catheter stops draining pee.  Your catheter falls out.  You have increased bleeding that does not stop after you have rested and increased the amount of fluids you had been drinking. This information is not intended to replace advice given to you by your health care provider. Make sure you discuss any questions you have with your health care provider. Document Released: 01/01/2008 Document Revised: 12/21/2015 Document Reviewed: 12/24/2012 Elsevier Interactive Patient Education  2017 [employer] Wound Care Keep incision covered and dry for two days.  If you shower, cover incision with plastic wrap.  Do not put any creams, lotions, or ointments on incision. Leave steri-strips on back.  They will fall off by themselves. Activity Walk each and every day, increasing distance each day. No lifting greater than 5 lbs.  Avoid excessive neck motion. No driving for 2 weeks; may ride as a passenger locally. If provided with back brace, wear when out of bed.  It is not necessary to wear brace in bed. Diet Resume your normal diet.  Return to Work Will be discussed at you follow up appointment. Call Your Doctor If Any of  These Occur Redness, drainage, or swelling at the wound.  Temperature greater than 101 degrees. Severe pain not relieved by pain medication. Incision starts to come apart. Follow Up Appt Call today for appointment in 1-2 weeks ([phone]) or for problems.  If you have any hardware placed in your spine, you will need an x-ray before your appointment.

## 2018-05-04 ENCOUNTER — Emergency Department (HOSPITAL_BASED_OUTPATIENT_CLINIC_OR_DEPARTMENT_OTHER)
Admission: EM | Admit: 2018-05-04 | Discharge: 2018-05-04 | Disposition: A | Discharge status: Home / Self Care | Payer: 59 | Attending: Emergency Medicine | Admitting: Emergency Medicine

## 2018-05-04 ENCOUNTER — Other Ambulatory Visit: Payer: Self-pay

## 2018-05-04 ENCOUNTER — Encounter (HOSPITAL_BASED_OUTPATIENT_CLINIC_OR_DEPARTMENT_OTHER): Payer: Self-pay

## 2018-05-04 DIAGNOSIS — Z79899 Other long term (current) drug therapy: Secondary | ICD-10-CM | POA: Insufficient documentation

## 2018-05-04 DIAGNOSIS — Z7984 Long term (current) use of oral hypoglycemic drugs: Secondary | ICD-10-CM | POA: Insufficient documentation

## 2018-05-04 DIAGNOSIS — R339 Retention of urine, unspecified: Secondary | ICD-10-CM | POA: Diagnosis not present

## 2018-05-04 NOTE — ED Notes (Signed)
Leg bag and extra drainage bag given to pt with instructions on use.

## 2018-05-04 NOTE — ED Notes (Signed)
Pt. Here due to inability to urinate after being released from [facility] on Sunday Morning after back surgery.  Pt. In a lot of pain in the bladder area due to holding urine.  Pt. Reports little amts of pee today but not enough to feel relief and feels like her bladder is going to burst

## 2018-05-04 NOTE — ED Provider Notes (Signed)
[facility] EMERGENCY DEPARTMENT Provider Note   CSN: [id number] Arrival date & time: 05/04/18  1807     History   Chief Complaint Chief Complaint  Patient presents with  . Urinary Retention    HPI Amy Matthews is a 37 y.o. female.  Patient is a very pleasant 37 year old female with a significant recent medical history of emergent lumbar decompression for cauda equina that had caused fecal incontinence and urinary retention presenting today with inability to urinate and extreme discomfort.  Patient had surgery on early Saturday morning and the catheter was removed yesterday morning.  Prior to discharge she was able to urinate 50 mL's but had 450 in her bladder.  However they felt it was okay for her to go home.  She states since that time she is only dribbled urine but has not been able to pee.  She states her bowel movements are back to normal and that that is not having any issues.  She denies fever, leg pain or weakness.  The history is provided by the patient.    Past Medical History:  Diagnosis Date  . HRP (high risk pregnancy)    prolonged hospitalization, cerclage done  . IUD (intrauterine device) in place 2006   Mirena  . Obesity   . Wears contact lenses     Patient Active Problem List   Diagnosis Date Noted  . S/P lumbar laminectomy 05/02/2018  . Cauda equina syndrome (HCC) 05/02/2018    Past Surgical History:  Procedure Laterality Date  . CERVICAL CERCLAGE    . CYST EXCISION  2004   under right breast  . DILATION AND CURETTAGE OF UTERUS  2010   due to miscarriage  . LUMBAR LAMINECTOMY/DECOMPRESSION MICRODISCECTOMY Left 05/02/2018   Procedure: LUMBAR LAMINECTOMY/DECOMPRESSION MICRODISCECTOMY, LUMBAR FIVE - SACRAL ONE LEFT;  Surgeon: Julio Sicks, MD;  Location: [facility] OR;  Service: Neurosurgery;  Laterality: Left;     OB History   None      Home Medications    Prior to Admission medications   Medication Sig Start Date End Date Taking?  Authorizing Provider  albuterol (PROVENTIL HFA;VENTOLIN HFA) 108 (90 BASE) MCG/ACT inhaler Inhale 2 puffs into the lungs every 6 (six) hours as needed for wheezing or shortness of breath. Patient not taking: Reported on 05/02/2018 09/19/14   Tysinger, Kermit Balo, PA-C  benzonatate (TESSALON) 200 MG capsule Take 1 capsule (200 mg total) by mouth 3 (three) times daily as needed for cough. Patient not taking: Reported on 05/02/2018 09/19/14   Tysinger, Kermit Balo, PA-C  ciprofloxacin (CIPRO) 500 MG tablet Take 500 mg by mouth 2 (two) times daily. 04/30/18   [provider]  cyclobenzaprine (FLEXERIL) 10 MG tablet Take 1 tablet (10 mg total) by mouth 2 (two) times daily as needed for muscle spasms. 03/18/18   Fawze, Mina A, PA-C  cyclobenzaprine (FLEXERIL) 10 MG tablet Take 1 tablet (10 mg total) by mouth 3 (three) times daily as needed for muscle spasms. 05/03/18   Julio Sicks, MD  docusate sodium (COLACE) 250 MG capsule Take 1 capsule (250 mg total) by mouth daily. Patient not taking: Reported on 05/02/2018 03/18/18   Michela Pitcher A, PA-C  gabapentin (NEURONTIN) 600 MG tablet Take 600 mg by mouth 3 (three) times daily.    [provider]  HYDROcodone-acetaminophen (NORCO/VICODIN) 5-325 MG tablet Take 1-2 tablets by mouth every 4 (four) hours as needed for moderate pain ((score 4 to 6)). 05/03/18   Julio Sicks, MD  ibuprofen (ADVIL,MOTRIN) 800  MG tablet Take 1 tablet (800 mg total) 3 (three) times daily by mouth. Patient not taking: Reported on 05/02/2018 06/08/17   Eber Hong, MD  LEVEMIR FLEXTOUCH 100 UNIT/ML Pen Inject 10 Units into the skin daily. 03/12/18   [provider]  lidocaine (LIDODERM) 5 % Place 1 patch onto the skin daily. Remove & Discard patch within 12 hours or as directed by MD Patient not taking: Reported on 05/02/2018 03/18/18   Michela Pitcher A, PA-C  metFORMIN (GLUCOPHAGE) 500 MG tablet Take 500 mg by mouth 2 (two) times daily. 03/20/18   [provider]  morphine  (MSIR) 15 MG tablet Take 1 tablet (15 mg total) by mouth every 6 (six) hours as needed for severe pain. 03/18/18   Luevenia Maxin, Mina A, PA-C  naproxen (NAPROSYN) 500 MG tablet Take 1 tablet (500 mg total) by mouth 2 (two) times daily with a meal. 10/11/16   Georgiana Shore, PA-C    Family History Family History  Problem Relation Age of Onset  . Kidney disease Mother        dialysis  . Diabetes Mother   . Ulcers Father   . Heart disease Maternal Grandmother   . Other Maternal Grandfather        old age  . Dementia Paternal Grandmother   . Cancer Other        breast  . Hypertension Neg Hx   . Hyperlipidemia Neg Hx   . Stroke Neg Hx     Social History Social History   Tobacco Use  . Smoking status: Never Smoker  . Smokeless tobacco: Never Used  Substance Use Topics  . Alcohol use: Yes    Comment: social  . Drug use: No     Allergies   Patient has no known allergies.   Review of Systems Review of Systems  All other systems reviewed and are negative.    Physical Exam Updated Vital Signs BP (!) 146/83 (BP Location: Right Arm)   Pulse (!) 110   Temp 99.3 F (37.4 C) (Oral)   Resp 20   Ht 5' 5.5" (1.664 m)   Wt 106.1 kg   LMP 04/15/2018   SpO2 100%   BMI 38.35 kg/m   Physical Exam  Constitutional: She is oriented to person, place, and time. She appears well-developed and well-nourished. She appears distressed.  Extreme discomfort  HENT:  Head: Normocephalic and atraumatic.  Mouth/Throat: Oropharynx is clear and moist.  Eyes: Pupils are equal, round, and reactive to light. Conjunctivae and EOM are normal.  Neck: Normal range of motion. Neck supple.  Cardiovascular: Normal rate, regular rhythm and intact distal pulses.  No murmur heard. Pulmonary/Chest: Effort normal and breath sounds normal. No respiratory distress. She has no wheezes. She has no rales.  Abdominal: Soft. She exhibits no distension. There is no tenderness. There is no rebound and no guarding.    Bladder palpable up to the umbilicus and significant pain  Musculoskeletal: Normal range of motion. She exhibits no edema or tenderness.  Neurological: She is alert and oriented to person, place, and time. She has normal strength. No sensory deficit. Gait normal.  Skin: Skin is warm and dry. No rash noted. No erythema.  Psychiatric: She has a normal mood and affect. Her behavior is normal.  Nursing note and vitals reviewed.    ED Treatments / Results  Labs (all labs ordered are listed, but only abnormal results are displayed) Labs Reviewed - No data to display  EKG None  Radiology No results found.  Procedures Procedures (including critical care time)  Medications Ordered in ED Medications - No data to display   Initial Impression / Assessment and Plan / ED Course  I have reviewed the triage vital signs and the nursing notes.  Pertinent labs & imaging results that were available during my care of the patient were reviewed by me and considered in my medical decision making (see chart for details).     Patient presenting with urinary retention after surgical decompression for cauda equina.  Patient was not able to fully urinate prior to discharge home and has not been able to urinate since being home.  Foley catheter was placed with over 1600 mL's drained.  She now is completely asymptomatic.  She states she still having control of her bowels and has no other complaints.  Spoke with Costella with neurosurgery and at this time we will leave the Foley catheter in place that she will call Dr. Ethelene Browns office in the morning and get follow-up with urology in 1 week for catheter removal to ensure she is able to urinate.  Final Clinical Impressions(s) / ED Diagnoses   Final diagnoses:  Urinary retention    ED Discharge Orders    None       Gwyneth Sprout, MD 05/04/18 2025

## 2018-05-04 NOTE — ED Triage Notes (Signed)
Pt states was seen here Friday night, had back surgery Saturday and having pain and urinary retention same as Friday night

## 2018-05-05 MED FILL — Thrombin (Recombinant) For Soln 20000 Unit: CUTANEOUS | Qty: 1 | Status: AC

## 2018-05-11 ENCOUNTER — Encounter (HOSPITAL_BASED_OUTPATIENT_CLINIC_OR_DEPARTMENT_OTHER): Payer: Self-pay

## 2018-05-11 ENCOUNTER — Other Ambulatory Visit: Payer: Self-pay

## 2018-05-11 ENCOUNTER — Emergency Department (HOSPITAL_BASED_OUTPATIENT_CLINIC_OR_DEPARTMENT_OTHER)
Admission: EM | Admit: 2018-05-11 | Discharge: 2018-05-11 | Disposition: A | Discharge status: Home / Self Care | Payer: 59 | Attending: Emergency Medicine | Admitting: Emergency Medicine

## 2018-05-11 DIAGNOSIS — K5641 Fecal impaction: Secondary | ICD-10-CM | POA: Insufficient documentation

## 2018-05-11 DIAGNOSIS — Z79899 Other long term (current) drug therapy: Secondary | ICD-10-CM | POA: Diagnosis not present

## 2018-05-11 DIAGNOSIS — K5900 Constipation, unspecified: Secondary | ICD-10-CM | POA: Diagnosis present

## 2018-05-11 MED ORDER — FLEET ENEMA 7-19 GM/118ML RE ENEM
1.0000 | ENEMA | Freq: Once | RECTAL | Status: AC
  Administered 2018-05-11: 1 via RECTAL
  Filled 2018-05-11: qty 1

## 2018-05-11 NOTE — Discharge Instructions (Signed)
You may want to take 1 scoop of MiraLAX in 8 ounces of water daily for the next week just until your bowel movements become regular.

## 2018-05-11 NOTE — ED Triage Notes (Signed)
Pt c/o constipation-feels last BM 4 days-pt with recent back surgery 10/5-NAD-steady gait

## 2018-05-11 NOTE — ED Notes (Signed)
Pt/family verbalized understanding of discharge instructions.   

## 2018-05-11 NOTE — ED Provider Notes (Signed)
[facility] EMERGENCY DEPARTMENT Provider Note   CSN: [id number] Arrival date & time: 05/11/18  1631     History   Chief Complaint Chief Complaint  Patient presents with  . Constipation    HPI Amy Matthews is a 37 y.o. female.  The history is provided by the patient.  Constipation   This is a new problem. Episode onset: 4 days ago. The stool is described as firm. Associated symptoms include flatus. Pertinent negatives include no abdominal pain and no dysuria. Associated symptoms comments: No nausea vomiting, fever.  No abdominal pain.  She states it feels like it is right in her rectum but she just cannot get anything out. She does not exercise regularly. There has been adequate water intake. She has tried stimulants and osmotic agents (Magnesium citrate and stool softeners) for the symptoms. The treatment provided no relief. Risk factors: Patient recently had spinal decompression for cauda equina.  She currently still has a leg bag because of ongoing urinary retention and she has been taking hydrocodone intermittently for her back pain since her surgery.    Past Medical History:  Diagnosis Date  . HRP (high risk pregnancy)    prolonged hospitalization, cerclage done  . IUD (intrauterine device) in place 2006   Mirena  . Obesity   . Wears contact lenses     Patient Active Problem List   Diagnosis Date Noted  . S/P lumbar laminectomy 05/02/2018  . Cauda equina syndrome (HCC) 05/02/2018    Past Surgical History:  Procedure Laterality Date  . BACK SURGERY    . CERVICAL CERCLAGE    . CYST EXCISION  2004   under right breast  . DILATION AND CURETTAGE OF UTERUS  2010   due to miscarriage  . LUMBAR LAMINECTOMY/DECOMPRESSION MICRODISCECTOMY Left 05/02/2018   Procedure: LUMBAR LAMINECTOMY/DECOMPRESSION MICRODISCECTOMY, LUMBAR FIVE - SACRAL ONE LEFT;  Surgeon: Julio Sicks, MD;  Location: [facility] OR;  Service: Neurosurgery;  Laterality: Left;     OB History   None      Home Medications    Prior to Admission medications   Medication Sig Start Date End Date Taking? Authorizing Provider  albuterol (PROVENTIL HFA;VENTOLIN HFA) 108 (90 BASE) MCG/ACT inhaler Inhale 2 puffs into the lungs every 6 (six) hours as needed for wheezing or shortness of breath. Patient not taking: Reported on 05/02/2018 09/19/14   Tysinger, Kermit Balo, PA-C  benzonatate (TESSALON) 200 MG capsule Take 1 capsule (200 mg total) by mouth 3 (three) times daily as needed for cough. Patient not taking: Reported on 05/02/2018 09/19/14   Tysinger, Kermit Balo, PA-C  ciprofloxacin (CIPRO) 500 MG tablet Take 500 mg by mouth 2 (two) times daily. 04/30/18   [provider]  cyclobenzaprine (FLEXERIL) 10 MG tablet Take 1 tablet (10 mg total) by mouth 2 (two) times daily as needed for muscle spasms. 03/18/18   Fawze, Mina A, PA-C  cyclobenzaprine (FLEXERIL) 10 MG tablet Take 1 tablet (10 mg total) by mouth 3 (three) times daily as needed for muscle spasms. 05/03/18   Julio Sicks, MD  docusate sodium (COLACE) 250 MG capsule Take 1 capsule (250 mg total) by mouth daily. Patient not taking: Reported on 05/02/2018 03/18/18   Michela Pitcher A, PA-C  gabapentin (NEURONTIN) 600 MG tablet Take 600 mg by mouth 3 (three) times daily.    [provider]  HYDROcodone-acetaminophen (NORCO/VICODIN) 5-325 MG tablet Take 1-2 tablets by mouth every 4 (four) hours as needed for moderate pain ((score 4 to 6)). 05/03/18  Julio Sicks, MD  ibuprofen (ADVIL,MOTRIN) 800 MG tablet Take 1 tablet (800 mg total) 3 (three) times daily by mouth. Patient not taking: Reported on 05/02/2018 06/08/17   Eber Hong, MD  LEVEMIR FLEXTOUCH 100 UNIT/ML Pen Inject 10 Units into the skin daily. 03/12/18   [provider]  lidocaine (LIDODERM) 5 % Place 1 patch onto the skin daily. Remove & Discard patch within 12 hours or as directed by MD Patient not taking: Reported on 05/02/2018 03/18/18   Michela Pitcher A, PA-C  metFORMIN  (GLUCOPHAGE) 500 MG tablet Take 500 mg by mouth 2 (two) times daily. 03/20/18   [provider]  morphine (MSIR) 15 MG tablet Take 1 tablet (15 mg total) by mouth every 6 (six) hours as needed for severe pain. 03/18/18   Luevenia Maxin, Mina A, PA-C  naproxen (NAPROSYN) 500 MG tablet Take 1 tablet (500 mg total) by mouth 2 (two) times daily with a meal. 10/11/16   Georgiana Shore, PA-C    Family History Family History  Problem Relation Age of Onset  . Kidney disease Mother        dialysis  . Diabetes Mother   . Ulcers Father   . Heart disease Maternal Grandmother   . Other Maternal Grandfather        old age  . Dementia Paternal Grandmother   . Cancer Other        breast  . Hypertension Neg Hx   . Hyperlipidemia Neg Hx   . Stroke Neg Hx     Social History Social History   Tobacco Use  . Smoking status: Never Smoker  . Smokeless tobacco: Never Used  Substance Use Topics  . Alcohol use: Yes    Comment: occ  . Drug use: No     Allergies   Patient has no known allergies.   Review of Systems Review of Systems  Gastrointestinal: Positive for constipation and flatus. Negative for abdominal pain.  Genitourinary: Negative for dysuria.  All other systems reviewed and are negative.    Physical Exam Updated Vital Signs BP (!) 160/104 (BP Location: Left Arm) Comment: retaken in [place]   Pulse 96   Temp 98.6 F (37 C) (Oral)   Resp 17   Ht 5\' 5"  (1.651 m)   Wt 105.6 kg   LMP 05/03/2018   SpO2 100%   BMI 38.72 kg/m   Physical Exam  Constitutional: She is oriented to person, place, and time. She appears well-developed and well-nourished. No distress.  HENT:  Head: Normocephalic and atraumatic.  Mouth/Throat: Oropharynx is clear and moist.  Eyes: Pupils are equal, round, and reactive to light. Conjunctivae and EOM are normal.  Neck: Normal range of motion. Neck supple.  Cardiovascular: Normal rate, regular rhythm and intact distal pulses.  No murmur  heard. Pulmonary/Chest: Effort normal and breath sounds normal. No respiratory distress. She has no wheezes. She has no rales.  Abdominal: Soft. She exhibits no distension. There is no tenderness. There is no rebound and no guarding.  Genitourinary: Rectum normal.  Genitourinary Comments: Fecal impaction  Musculoskeletal: Normal range of motion. She exhibits no edema or tenderness.  Surgical dressing in place in the lower spine.  Bandages clean dry and intact  Neurological: She is alert and oriented to person, place, and time.  Skin: Skin is warm and dry. No rash noted. No erythema.  Psychiatric: She has a normal mood and affect. Her behavior is normal.  Nursing note and vitals reviewed.    ED  Treatments / Results  Labs (all labs ordered are listed, but only abnormal results are displayed) Labs Reviewed - No data to display  EKG None  Radiology No results found.  Procedures Fecal disimpaction Date/Time: 05/11/2018 5:07 PM Performed by: Gwyneth Sprout, MD Authorized by: Gwyneth Sprout, MD  Consent: Verbal consent obtained. Consent given by: patient Local anesthesia used: no  Anesthesia: Local anesthesia used: no  Sedation: Patient sedated: no  Patient tolerance: Patient tolerated the procedure well with no immediate complications Comments: Large amt of stool removed from rectum.  No bleeding present    (including critical care time)  Medications Ordered in ED Medications  sodium phosphate (FLEET) 7-19 GM/118ML enema 1 enema (has no administration in time range)     Initial Impression / Assessment and Plan / ED Course  I have reviewed the triage vital signs and the nursing notes.  Pertinent labs & imaging results that were available during my care of the patient were reviewed by me and considered in my medical decision making (see chart for details).     Patient presenting today with a fecal impaction as a result of having to take pain medication for her  recent spinal surgery for cauda equina.  Patient still currently has a leg bag and is planning on having the Foley removed tomorrow.  She has no abdominal pain or concern for obstruction.  She has rectal tone today.  Disimpacted at bedside and the patient given an enema.  5:57 PM Pt improved and will d/c home.  Final Clinical Impressions(s) / ED Diagnoses   Final diagnoses:  Fecal impaction in rectum [facility])    ED Discharge Orders    None       Gwyneth Sprout, MD 05/11/18 1758

## 2018-05-19 ENCOUNTER — Encounter (HOSPITAL_BASED_OUTPATIENT_CLINIC_OR_DEPARTMENT_OTHER): Payer: Self-pay

## 2018-05-19 ENCOUNTER — Emergency Department (HOSPITAL_BASED_OUTPATIENT_CLINIC_OR_DEPARTMENT_OTHER)
Admission: EM | Admit: 2018-05-19 | Discharge: 2018-05-19 | Disposition: A | Discharge status: Home / Self Care | Payer: 59 | Attending: Emergency Medicine | Admitting: Emergency Medicine

## 2018-05-19 DIAGNOSIS — Z79899 Other long term (current) drug therapy: Secondary | ICD-10-CM | POA: Insufficient documentation

## 2018-05-19 DIAGNOSIS — K5641 Fecal impaction: Secondary | ICD-10-CM | POA: Diagnosis present

## 2018-05-19 MED ORDER — FLEET ENEMA 7-19 GM/118ML RE ENEM
1.0000 | ENEMA | Freq: Once | RECTAL | Status: AC
  Administered 2018-05-19: 1 via RECTAL
  Filled 2018-05-19: qty 1

## 2018-05-19 NOTE — ED Provider Notes (Signed)
[facility] EMERGENCY DEPARTMENT Provider Note   CSN: [id number] Arrival date & time: 05/19/18  7829     History   Chief Complaint Chief Complaint  Patient presents with  . Fecal Impaction    HPI Amy Matthews is a 37 y.o. female.  The history is provided by the patient, a significant other and medical records. No language interpreter was used.  Constipation   This is a recurrent problem. The current episode started more than 2 days ago. Pertinent negatives include no abdominal pain and no dysuria. There is fiber in the patient's diet. She has tried osmotic agents (miralax) for the symptoms. The treatment provided no relief. Risk factors: recent surgery.    Past Medical History:  Diagnosis Date  . HRP (high risk pregnancy)    prolonged hospitalization, cerclage done  . IUD (intrauterine device) in place 2006   Mirena  . Obesity   . Wears contact lenses     Patient Active Problem List   Diagnosis Date Noted  . S/P lumbar laminectomy 05/02/2018  . Cauda equina syndrome (HCC) 05/02/2018    Past Surgical History:  Procedure Laterality Date  . BACK SURGERY    . CERVICAL CERCLAGE    . CYST EXCISION  2004   under right breast  . DILATION AND CURETTAGE OF UTERUS  2010   due to miscarriage  . LUMBAR LAMINECTOMY/DECOMPRESSION MICRODISCECTOMY Left 05/02/2018   Procedure: LUMBAR LAMINECTOMY/DECOMPRESSION MICRODISCECTOMY, LUMBAR FIVE - SACRAL ONE LEFT;  Surgeon: Julio Sicks, MD;  Location: [facility] OR;  Service: Neurosurgery;  Laterality: Left;     OB History   None      Home Medications    Prior to Admission medications   Medication Sig Start Date End Date Taking? Authorizing Provider  albuterol (PROVENTIL HFA;VENTOLIN HFA) 108 (90 BASE) MCG/ACT inhaler Inhale 2 puffs into the lungs every 6 (six) hours as needed for wheezing or shortness of breath. Patient not taking: Reported on 05/02/2018 09/19/14   Tysinger, Kermit Balo, PA-C  benzonatate (TESSALON) 200 MG  capsule Take 1 capsule (200 mg total) by mouth 3 (three) times daily as needed for cough. Patient not taking: Reported on 05/02/2018 09/19/14   Tysinger, Kermit Balo, PA-C  ciprofloxacin (CIPRO) 500 MG tablet Take 500 mg by mouth 2 (two) times daily. 04/30/18   [provider]  cyclobenzaprine (FLEXERIL) 10 MG tablet Take 1 tablet (10 mg total) by mouth 2 (two) times daily as needed for muscle spasms. 03/18/18   Fawze, Mina A, PA-C  cyclobenzaprine (FLEXERIL) 10 MG tablet Take 1 tablet (10 mg total) by mouth 3 (three) times daily as needed for muscle spasms. 05/03/18   Julio Sicks, MD  docusate sodium (COLACE) 250 MG capsule Take 1 capsule (250 mg total) by mouth daily. Patient not taking: Reported on 05/02/2018 03/18/18   Michela Pitcher A, PA-C  gabapentin (NEURONTIN) 600 MG tablet Take 600 mg by mouth 3 (three) times daily.    [provider]  HYDROcodone-acetaminophen (NORCO/VICODIN) 5-325 MG tablet Take 1-2 tablets by mouth every 4 (four) hours as needed for moderate pain ((score 4 to 6)). 05/03/18   Julio Sicks, MD  ibuprofen (ADVIL,MOTRIN) 800 MG tablet Take 1 tablet (800 mg total) 3 (three) times daily by mouth. Patient not taking: Reported on 05/02/2018 06/08/17   Eber Hong, MD  LEVEMIR FLEXTOUCH 100 UNIT/ML Pen Inject 10 Units into the skin daily. 03/12/18   [provider]  lidocaine (LIDODERM) 5 % Place 1 patch onto the skin daily.  Remove & Discard patch within 12 hours or as directed by MD Patient not taking: Reported on 05/02/2018 03/18/18   Michela Pitcher A, PA-C  metFORMIN (GLUCOPHAGE) 500 MG tablet Take 500 mg by mouth 2 (two) times daily. 03/20/18   [provider]  morphine (MSIR) 15 MG tablet Take 1 tablet (15 mg total) by mouth every 6 (six) hours as needed for severe pain. 03/18/18   Luevenia Maxin, Mina A, PA-C  naproxen (NAPROSYN) 500 MG tablet Take 1 tablet (500 mg total) by mouth 2 (two) times daily with a meal. 10/11/16   Georgiana Shore, PA-C    Family  History Family History  Problem Relation Age of Onset  . Kidney disease Mother        dialysis  . Diabetes Mother   . Ulcers Father   . Heart disease Maternal Grandmother   . Other Maternal Grandfather        old age  . Dementia Paternal Grandmother   . Cancer Other        breast  . Hypertension Neg Hx   . Hyperlipidemia Neg Hx   . Stroke Neg Hx     Social History Social History   Tobacco Use  . Smoking status: Never Smoker  . Smokeless tobacco: Never Used  Substance Use Topics  . Alcohol use: Yes    Comment: occ  . Drug use: No     Allergies   Patient has no known allergies.   Review of Systems Review of Systems  Constitutional: Negative for chills, diaphoresis, fatigue and fever.  HENT: Negative for congestion, ear pain and sore throat.   Eyes: Negative for pain and visual disturbance.  Respiratory: Negative for cough, chest tightness and shortness of breath.   Cardiovascular: Negative for chest pain and palpitations.  Gastrointestinal: Positive for constipation. Negative for abdominal pain, diarrhea, nausea and vomiting.  Genitourinary: Negative for dysuria, flank pain, frequency and hematuria.  Musculoskeletal: Negative for arthralgias, back pain, neck pain and neck stiffness.  Skin: Negative for color change, rash and wound.  Neurological: Negative for seizures and syncope.  Psychiatric/Behavioral: Negative for agitation.  All other systems reviewed and are negative.    Physical Exam Updated Vital Signs BP 140/90 (BP Location: Left Arm)   Pulse (!) 110   Temp 98.6 F (37 C) (Oral)   Resp 18   LMP 05/03/2018   SpO2 100%   Physical Exam  Constitutional: She appears well-developed and well-nourished. No distress.  HENT:  Head: Normocephalic and atraumatic.  Mouth/Throat: Oropharynx is clear and moist.  Eyes: Conjunctivae are normal.  Neck: Neck supple.  Cardiovascular: Normal rate and regular rhythm.  No murmur heard. Pulmonary/Chest: Effort  normal and breath sounds normal. No respiratory distress. She has no wheezes. She has no rales. She exhibits no tenderness.  Abdominal: Soft. There is no tenderness.  Genitourinary: Rectal exam shows no external hemorrhoid, no internal hemorrhoid and anal tone normal.  Genitourinary Comments: Patient's rectum was impacted.  Disimpaction was performed without difficulty.  Large amount of hard stool removed.  No bleeding.  Normal rectal tone.  Normal sensation.  Musculoskeletal: She exhibits no edema or tenderness.       Lumbar back: She exhibits laceration (surgiccal swound). She exhibits no tenderness, no bony tenderness, no swelling, no edema and no pain.       Back:  Neurological: She is alert. No sensory deficit. She exhibits normal muscle tone.  Skin: Skin is warm and dry. Capillary refill takes less than  2 seconds. No rash noted. She is not diaphoretic. No erythema.  Psychiatric: She has a normal mood and affect.  Nursing note and vitals reviewed.    ED Treatments / Results  Labs (all labs ordered are listed, but only abnormal results are displayed) Labs Reviewed - No data to display  EKG None  Radiology No results found.  Procedures Fecal disimpaction Date/Time: 05/19/2018 10:14 AM Performed by: Heide Scales, MD Authorized by: Heide Scales, MD  Consent: Verbal consent obtained. Risks and benefits: risks, benefits and alternatives were discussed Consent given by: patient Patient understanding: patient states understanding of the procedure being performed Patient identity confirmed: verbally with patient and provided demographic data Time out: Immediately prior to procedure a "time out" was called to verify the correct patient, procedure, equipment, support staff and site/side marked as required. Preparation: Patient was prepped and draped in the usual sterile fashion. Local anesthesia used: no  Anesthesia: Local anesthesia used:  no  Sedation: Patient sedated: no  Patient tolerance: Patient tolerated the procedure well with no immediate complications    (including critical care time)    Medications Ordered in ED Medications  sodium phosphate (FLEET) 7-19 GM/118ML enema 1 enema (1 enema Rectal Given 05/19/18 1017)     Initial Impression / Assessment and Plan / ED Course  I have reviewed the triage vital signs and the nursing notes.  Pertinent labs & imaging results that were available during my care of the patient were reviewed by me and considered in my medical decision making (see chart for details).     Amy Matthews is a 37 y.o. female with a past medical history significant for recent lumbar laminectomy for cauda equina syndrome on 10/5 who presents with recurrent constipation.  Patient reports that after her surgery, she had constipation requiring manual disimpaction at this facility.  She reports that for the last week she has had continued constipation and decreased bowel movements.  She reports she is still passing gas and has no numbness or tingling in her bottom or her legs.  She reports that she previously had a urinary catheter because she was having inability to urinate.  She reports her catheters been removed and she is now urinating well.  Overall her symptoms are improving however she says that despite MiraLAX she is still having constipation.  She denies any abdominal pain, nausea, vomiting, lightheadedness or other discomfort aside from mild rectal pain.  Next  On exam, patient's abdomen was nontender.  Back was nontender.  Patient had surgical dressing in place with no drainage or purulence, fluctuance, or tenderness on the surgical site.  Lungs clear chest nontender.  Normal strength, sensation, reflexes, and pulses in her legs.  Rectal exam was performed with a chaperone and patient had impacted stool.  This was manually removed.  Patient will have enema to further get her bowels moving.     Given patient's resolution of her constipation and discomfort, we feel she is likely safe for discharge home.    We discussed the need for repeat MRI for further work-up of her postoperative constipation however she says that since her urine has improved she would like to hold on doing any imaging with her lack of back pain or abdominal pain.  She will follow-up with her spine doctor and her PCP for further management.     11:10 AM Patient reported feeling much better after disimpaction in the enema.  She is now having a bowel movement.  She is seeing  her neurosurgeon tomorrow and will discuss this with him.  She agrees with not pursuing imaging at this time.    Given patient's resolution of symptoms, patient will be discharged home.  Patient discharged in good condition with understanding return precautions.   Final Clinical Impressions(s) / ED Diagnoses   Final diagnoses:  Fecal impaction [facility])    ED Discharge Orders    None      Clinical Impression: 1. Fecal impaction (HCC)     Disposition: Discharge  Condition: Good  I have discussed the results, Dx and Tx plan with the pt(& family if present). He/she/they expressed understanding and agree(s) with the plan. Discharge instructions discussed at great length. Strict return precautions discussed and pt &/or family have verbalized understanding of the instructions. No further questions at time of discharge.    New Prescriptions   No medications on file    Follow Up: Darryl Lent, PA-C [address] [place] [address] [phone]  Follow up   Your neurosurgeon and PCP     [facility] [place] [facility]MENT [address] [id number] [address]Q [facility] [place] [address] [phone]       Tegeler, Canary Brim, MD 05/19/18 [phone]

## 2018-05-19 NOTE — Discharge Instructions (Signed)
Her exam today was consistent with a fecal impaction.  We were able to manually disimpact you and then use an enema to help continue your bowel movement.  Please use the MiraLAX at home as you have been and follow-up with your neurosurgeon tomorrow.  Based on your lack of new back pain or abdominal pain, we felt comfortable not getting further imaging or MRI today however if your impaction symptoms return, you may need further imaging.  If any symptoms change or worsen before your follow-up, please return to the nearest emergency department.  Stay hydrated.

## 2018-05-19 NOTE — ED Triage Notes (Signed)
Pt states had emergency back surgery 10/5 and second time here for constipation. States taking miralax every day, had foley cath taken out yesterday. States feels the stool at her rectum

## 2019-07-28 ENCOUNTER — Other Ambulatory Visit: Payer: Self-pay

## 2019-07-28 ENCOUNTER — Encounter (HOSPITAL_BASED_OUTPATIENT_CLINIC_OR_DEPARTMENT_OTHER): Payer: Self-pay | Admitting: *Deleted

## 2019-07-28 DIAGNOSIS — M791 Myalgia, unspecified site: Secondary | ICD-10-CM | POA: Diagnosis present

## 2019-07-28 DIAGNOSIS — U071 COVID-19: Secondary | ICD-10-CM | POA: Diagnosis not present

## 2019-07-28 DIAGNOSIS — Z794 Long term (current) use of insulin: Secondary | ICD-10-CM | POA: Diagnosis not present

## 2019-07-28 NOTE — ED Triage Notes (Signed)
Pt c/o exposure over weekend to covid , today fever , chills body aches .

## 2019-07-29 ENCOUNTER — Emergency Department (HOSPITAL_BASED_OUTPATIENT_CLINIC_OR_DEPARTMENT_OTHER)
Admission: EM | Admit: 2019-07-29 | Discharge: 2019-07-29 | Disposition: A | Discharge status: Home / Self Care | Payer: Managed Care, Other (non HMO) | Attending: Emergency Medicine | Admitting: Emergency Medicine

## 2019-07-29 DIAGNOSIS — U071 COVID-19: Secondary | ICD-10-CM

## 2019-07-29 LAB — SARS CORONAVIRUS 2 AG (30 MIN TAT): SARS Coronavirus 2 Ag: POSITIVE — AB

## 2019-07-29 NOTE — ED Provider Notes (Signed)
[facility] DEPT [facility] Provider Note: Amy Spurling, MD, FACEP  CSN: [id number] MRN: [id number] ARRIVAL: 07/28/19 at 2310 ROOM: [place] Body Aches   HISTORY OF PRESENT ILLNESS  07/29/19 2:01 AM Amy Matthews is a 38 y.o. female with 2 days of generalized body aches, headache, nasal congestion and fatigue.  Her symptoms are moderate to severe.  She has taken Tylenol with partial relief.  She had a transient sore throat and has had a cough that is sometimes productive, particularly in the mornings.  She denies shortness of breath, nausea, vomiting or diarrhea.  She is not sure she has had a fever but has had chills.  She is concerned she may have been exposed to Covid recently.   Past Medical History:  Diagnosis Date  . HRP (high risk pregnancy)    prolonged hospitalization, cerclage done  . IUD (intrauterine device) in place 2006   Mirena  . Obesity   . Wears contact lenses     Past Surgical History:  Procedure Laterality Date  . BACK SURGERY    . CERVICAL CERCLAGE    . CYST EXCISION  2004   under right breast  . DILATION AND CURETTAGE OF UTERUS  2010   due to miscarriage  . LUMBAR LAMINECTOMY/DECOMPRESSION MICRODISCECTOMY Left 05/02/2018   Procedure: LUMBAR LAMINECTOMY/DECOMPRESSION MICRODISCECTOMY, LUMBAR FIVE - SACRAL ONE LEFT;  Surgeon: Earnie Larsson, MD;  Location: [place];  Service: Neurosurgery;  Laterality: Left;    Family History  Problem Relation Age of Onset  . Kidney disease Mother        dialysis  . Diabetes Mother   . Ulcers Father   . Heart disease Maternal Grandmother   . Other Maternal Grandfather        old age  . Dementia Paternal Grandmother   . Cancer Other        breast  . Hypertension Neg Hx   . Hyperlipidemia Neg Hx   . Stroke Neg Hx     Social History   Tobacco Use  . Smoking status: Never Smoker  . Smokeless tobacco: Never Used  Substance Use Topics  . Alcohol use: Yes    Comment: occ  . Drug  use: No    Prior to Admission medications   Medication Sig Start Date End Date Taking? Authorizing Provider  gabapentin (NEURONTIN) 600 MG tablet Take 600 mg by mouth 3 (three) times daily.    [provider]  LEVEMIR FLEXTOUCH 100 UNIT/ML Pen Inject 10 Units into the skin daily. 03/12/18   [provider]  metFORMIN (GLUCOPHAGE) 500 MG tablet Take 500 mg by mouth 2 (two) times daily. 03/20/18   [provider]  albuterol (PROVENTIL HFA;VENTOLIN HFA) 108 (90 BASE) MCG/ACT inhaler Inhale 2 puffs into the lungs every 6 (six) hours as needed for wheezing or shortness of breath. Patient not taking: Reported on 05/02/2018 09/19/14 07/29/19  Tysinger, Camelia Eng, PA-C    Allergies Patient has no known allergies.   REVIEW OF SYSTEMS  Negative except as noted here or in the History of Present Illness.   PHYSICAL EXAMINATION  Initial Vital Signs Blood pressure (!) 165/103, pulse 95, temperature 98.9 F (37.2 C), temperature source Oral, resp. rate 16, height 5' 5"  (1.651 m), weight 104.3 kg, SpO2 98 %.  Examination General: Well-developed, well-nourished female in no acute distress; appearance consistent with age of record HENT: normocephalic; atraumatic; nasal congestion Eyes: pupils equal, round and reactive to light; extraocular muscles intact  Neck: supple Heart: regular rate and rhythm Lungs: clear to auscultation bilaterally Abdomen: soft; nondistended; nontender; bowel sounds present Extremities: No deformity; full range of motion Neurologic: Awake, alert and oriented; motor function intact in all extremities and symmetric; no facial droop Skin: Warm and dry Psychiatric: Normal mood and affect   RESULTS  Summary of this visit's results, reviewed and interpreted by myself:   EKG Interpretation  Date/Time:    Ventricular Rate:    PR Interval:    QRS Duration:   QT Interval:    QTC Calculation:   R Axis:     Text Interpretation:        Laboratory  Studies: Results for orders placed or performed during the hospital encounter of 07/29/19 (from the past 24 hour(s))  SARS Coronavirus 2 Ag (30 min TAT) - Nasal Swab (BD Veritor Kit)     Status: Abnormal   Collection Time: 07/29/19  2:02 AM   Specimen: Nasal Swab (BD Veritor Kit)  Result Value Ref Range   SARS Coronavirus 2 Ag POSITIVE (A) NEGATIVE   Imaging Studies: No results found.  ED COURSE and MDM  Nursing notes, initial and subsequent vitals signs, including pulse oximetry, reviewed and interpreted by myself.  Vitals:   07/28/19 2317 07/28/19 2318 07/28/19 2320  BP:   (!) 165/103  Pulse: 95    Resp: 16    Temp: 98.9 F (37.2 C)    TempSrc: Oral    SpO2: 98%    Weight:  104.3 kg   Height:  5' 5"  (1.651 m)    Medications - No data to display  Amy Matthews was evaluated in Emergency Department on 07/29/2019 for the symptoms described in the history of present illness. She was evaluated in the context of the global COVID-19 pandemic, which necessitated consideration that the patient might be at risk for infection with the SARS-CoV-2 virus that causes COVID-19. Institutional protocols and algorithms that pertain to the evaluation of patients at risk for COVID-19 are in a state of rapid change based on information released by regulatory bodies including the CDC and federal and state organizations. These policies and algorithms were followed during the patient's care in the ED.   PROCEDURES  Procedures   ED DIAGNOSES     ICD-10-CM   1. COVID-19 virus infection  U07.1        Jilda Kress, MD 07/29/19 [phone]

## 2019-07-30 ENCOUNTER — Telehealth: Payer: Self-pay | Admitting: Infectious Diseases

## 2019-07-30 DIAGNOSIS — Z6839 Body mass index (BMI) 39.0-39.9, adult: Secondary | ICD-10-CM | POA: Insufficient documentation

## 2019-07-30 NOTE — Telephone Encounter (Signed)
Called to discuss with patient about Covid symptoms and the use of bamlanivimab, a monoclonal antibody infusion for those with mild to moderate Covid symptoms and at a high risk of hospitalization.  Pt is qualified for this infusion at the [facility] infusion center due to Diabetes and BMI>35.    Message left to call back for consideration. Her symptoms started 07/27/19. Nasal congestion is better but body aches and fatigue are still severe.   She would like to think about it and call back to call center.

## 2019-10-17 IMAGING — MR MR LUMBAR SPINE W/O CM
6 of 11 series · 20 of 48 positions shown · non-contrast
Comparison: None.

CLINICAL DATA: 37 y/o F; loss of bladder function. No known injury.

EXAM:
MRI LUMBAR SPINE WITHOUT CONTRAST
TECHNIQUE: Multiplanar, multisequence MR imaging of the lumbar spine was
performed. No intravenous contrast was administered.

[Series 6: T1 · sagittal · 4.0mm · 0.88mm/px · 2 of 16 slices shown (1 of 4)]
[im 1/16]
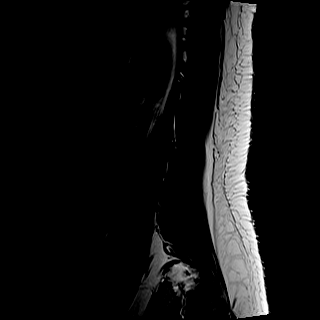
[im 16/16]
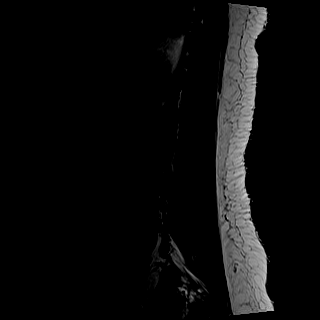

[Series 8: T1 · sagittal · 4.0mm · 0.88mm/px · 2 of 16 slices shown (2 of 4)]
[im 1/16]
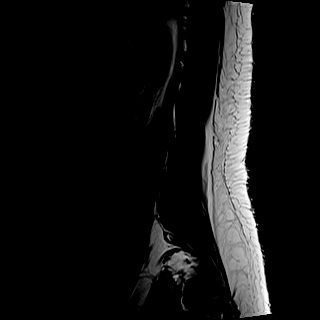
[im 16/16]
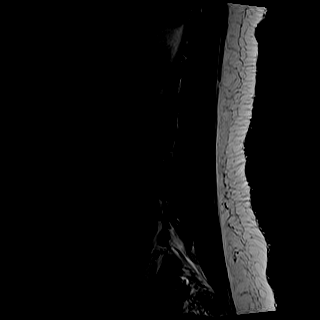

[Series 9: T2 · sagittal · 4.0mm · 0.73mm/px · 2 of 16 slices shown (1 of 2)]
[im 1/16]
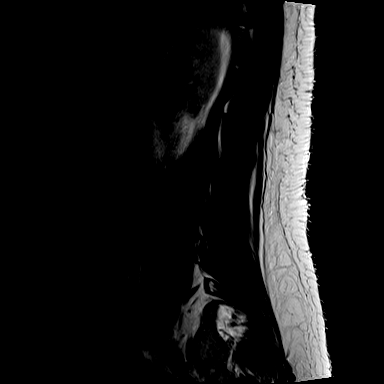
[im 16/16]
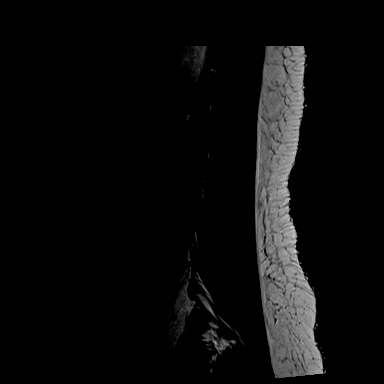

[Series 10: T2 · axial · 4.0mm · 0.57mm/px · z∈[-153,+56]mm · 5 of 34 slices shown (2 of 2)]
[im 1/34]
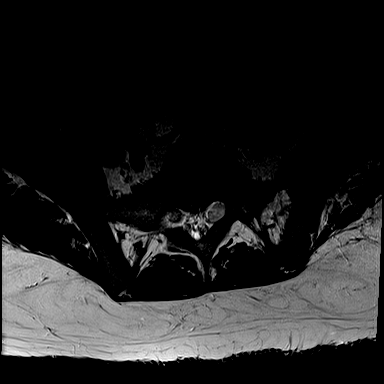
[im 9/34]
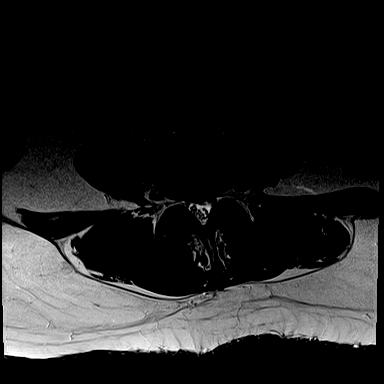
[im 17/34]
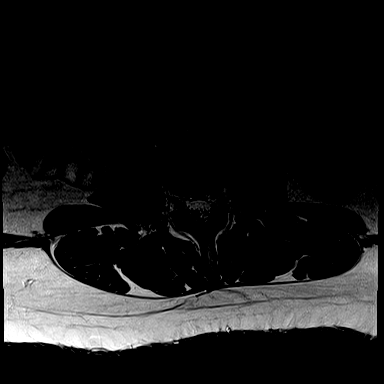
[im 25/34]
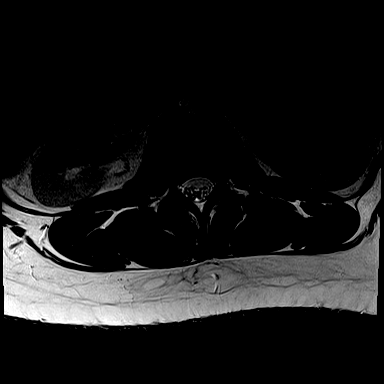
[im 34/34]
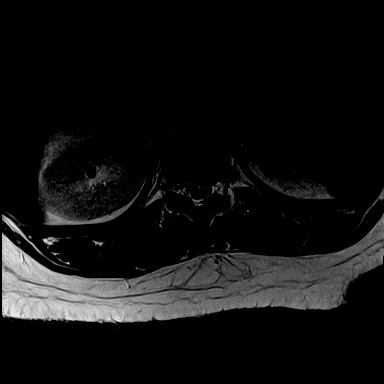

[Series 11: T1 · axial · 4.0mm · 0.34mm/px · z∈[-153,+56]mm · 5 of 34 slices shown (3 of 4)]
[im 1/34]
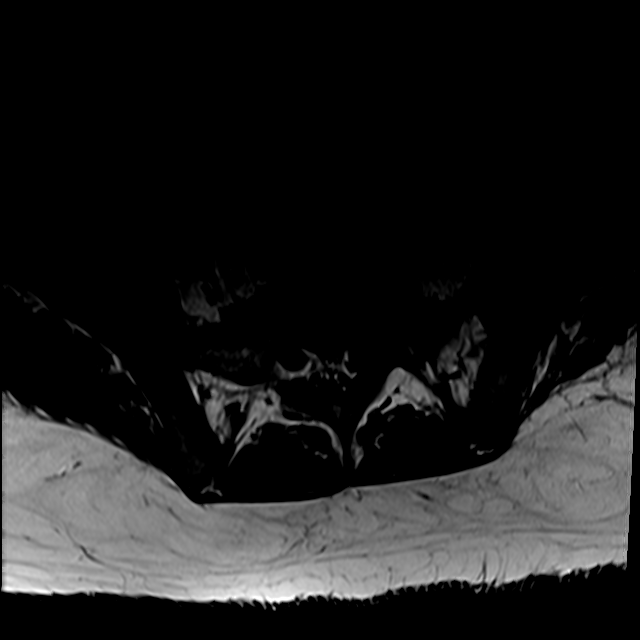
[im 9/34]
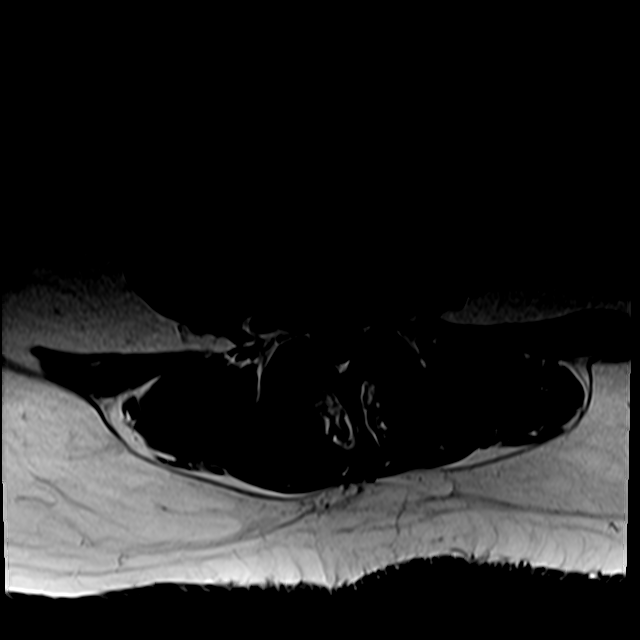
[im 17/34]
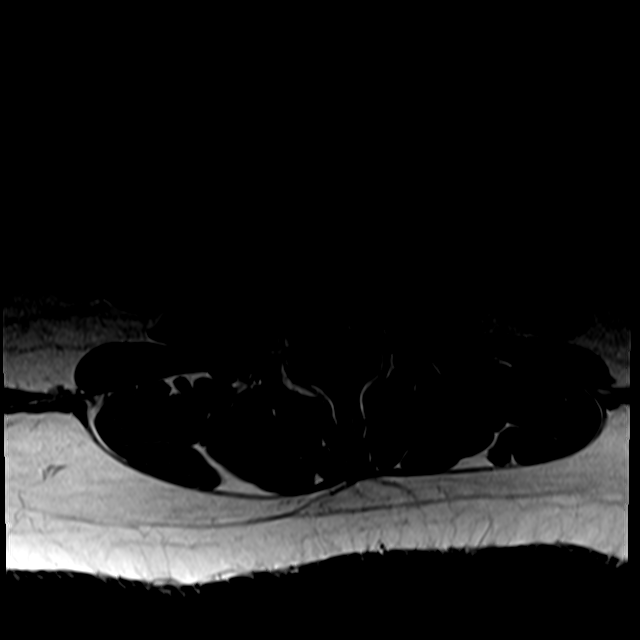
[im 25/34]
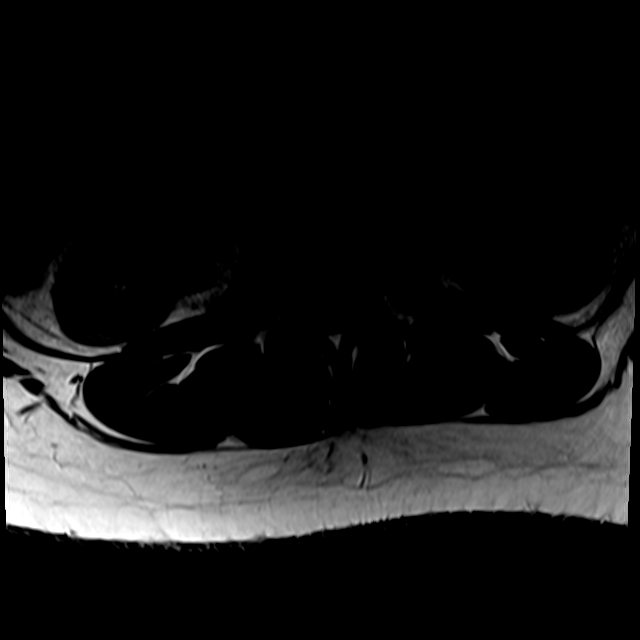
[im 34/34]
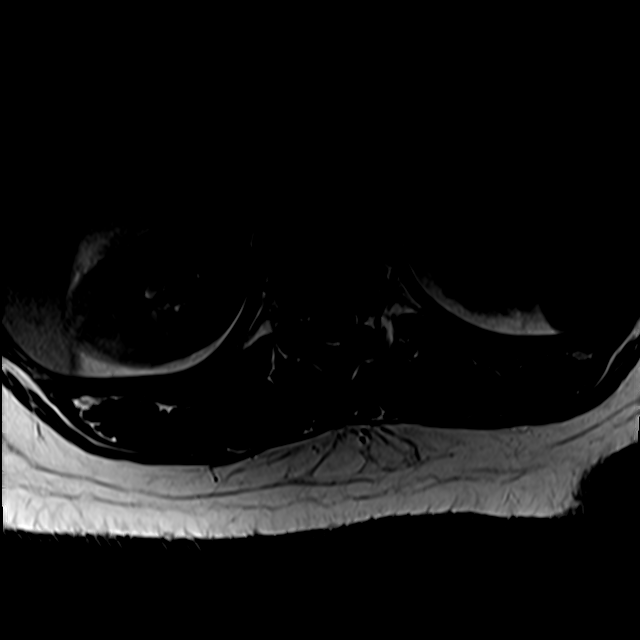

[Series 12: T1 · axial · 4.0mm · 0.34mm/px · z∈[-153,+12]mm · 4 of 34 slices shown (4 of 4)]
[im 1/34]
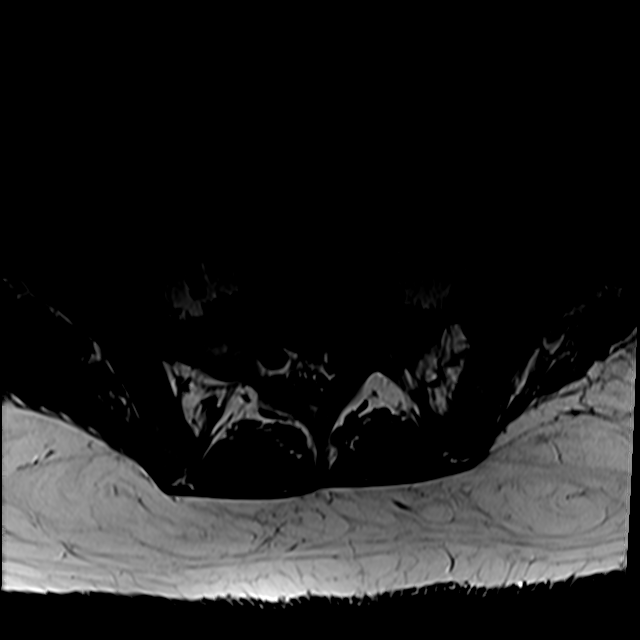
[im 9/34]
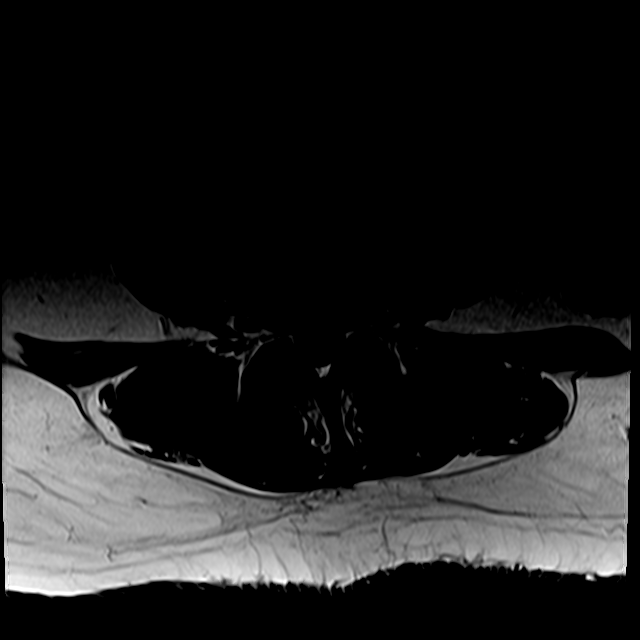
[im 17/34]
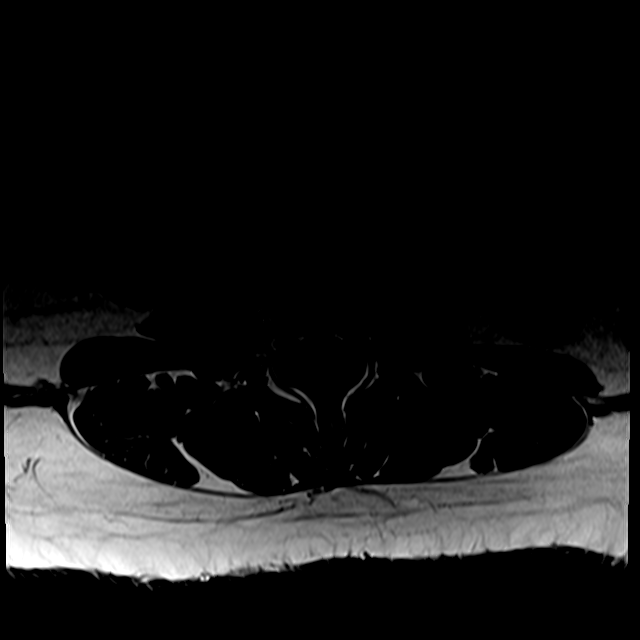
[im 25/34]
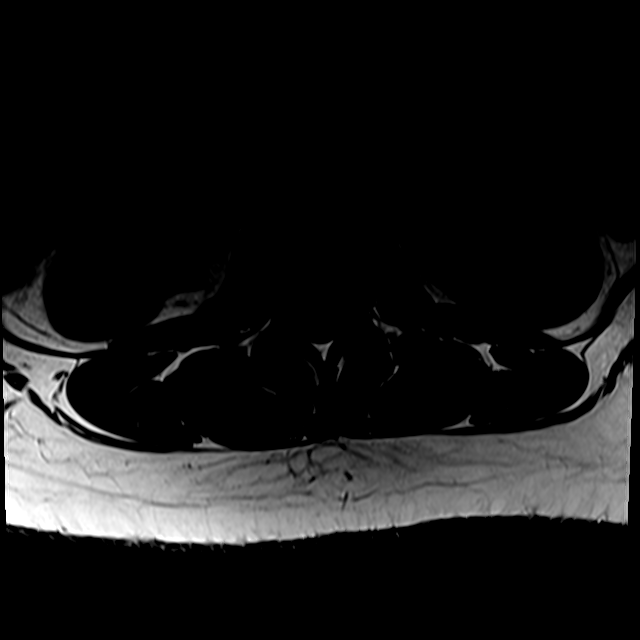

[20 of 48 positions shown; findings below may reference images not displayed]

FINDINGS: Segmentation:  Standard.

Alignment:  Physiologic.

Vertebrae:  No fracture, evidence of discitis, or bone lesion.

Conus medullaris and cauda equina: Conus extends to the upper L2
level. Conus and cauda equina appear normal.

Paraspinal and other soft tissues: Negative.

Disc levels:

L1-2: No significant disc displacement, foraminal stenosis, or canal
stenosis.

L2-3: No significant disc displacement, foraminal stenosis, or canal
stenosis.

L3-4: No significant disc displacement, foraminal stenosis, or canal
stenosis.

L4-5: Small central disc protrusion with annular fissure. No
significant foraminal or canal stenosis.

L5-S1: Large left subarticular disc extrusion with tenting of the
thecal sac and disc material completely effacing the spinal canal
resulting in compression of the cauda equina (series 9, image 10,
series 10, image 31, and series 11, image 31). The bilateral S1
nerve roots below the level of the extrusion or enlarged likely
representing edema due to mass effect.
IMPRESSION: 1. L5-S1 large left subarticular disc extrusion with disc material
completely effacing the spinal canal resulting in compression of the
cauda equina.
2. L4-5 small central disc protrusion and annular fissure. No
foraminal or canal stenosis.
3. Mild enlargement of S1 nerve roots below level of cauda equina
compression, likely edema from mass effect.

These results were called by telephone at the time of interpretation
on 05/02/2018 at [DATE] to Dr. MAYELI HOFER , who verbally
acknowledged these results.

By: Bambucafe Tarla M.D.

## 2019-11-11 ENCOUNTER — Encounter (HOSPITAL_BASED_OUTPATIENT_CLINIC_OR_DEPARTMENT_OTHER): Payer: Self-pay | Admitting: Emergency Medicine

## 2019-11-11 ENCOUNTER — Emergency Department (HOSPITAL_BASED_OUTPATIENT_CLINIC_OR_DEPARTMENT_OTHER)
Admission: EM | Admit: 2019-11-11 | Discharge: 2019-11-11 | Disposition: A | Discharge status: Home / Self Care | Payer: 59 | Attending: Emergency Medicine | Admitting: Emergency Medicine

## 2019-11-11 ENCOUNTER — Other Ambulatory Visit: Payer: Self-pay

## 2019-11-11 DIAGNOSIS — Y929 Unspecified place or not applicable: Secondary | ICD-10-CM | POA: Diagnosis not present

## 2019-11-11 DIAGNOSIS — Y9389 Activity, other specified: Secondary | ICD-10-CM | POA: Insufficient documentation

## 2019-11-11 DIAGNOSIS — T2125XA Burn of second degree of buttock, initial encounter: Secondary | ICD-10-CM | POA: Diagnosis not present

## 2019-11-11 DIAGNOSIS — Y998 Other external cause status: Secondary | ICD-10-CM | POA: Diagnosis not present

## 2019-11-11 DIAGNOSIS — T2105XA Burn of unspecified degree of buttock, initial encounter: Secondary | ICD-10-CM | POA: Diagnosis present

## 2019-11-11 DIAGNOSIS — Z794 Long term (current) use of insulin: Secondary | ICD-10-CM | POA: Diagnosis not present

## 2019-11-11 DIAGNOSIS — Z79899 Other long term (current) drug therapy: Secondary | ICD-10-CM | POA: Diagnosis not present

## 2019-11-11 DIAGNOSIS — X141XXA Other contact with hot air and other hot gases, initial encounter: Secondary | ICD-10-CM | POA: Diagnosis not present

## 2019-11-11 DIAGNOSIS — E119 Type 2 diabetes mellitus without complications: Secondary | ICD-10-CM | POA: Insufficient documentation

## 2019-11-11 HISTORY — DX: Type 2 diabetes mellitus without complications: E11.9

## 2019-11-11 MED ORDER — SILVER SULFADIAZINE 1 % EX CREA
TOPICAL_CREAM | CUTANEOUS | Status: AC
  Filled 2019-11-11: qty 85

## 2019-11-11 MED ORDER — SILVER SULFADIAZINE 1 % EX CREA: TOPICAL_CREAM | Freq: Two times a day (BID) | CUTANEOUS | Status: DC
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
## 2019-11-11 NOTE — Discharge Instructions (Signed)
Keep wound clean and dry.  Monitor for any signs of infection.  You may apply Silvadene cream once or twice daily until healed.  Return to ER immediately if you have any concerns for infection or develop any pain/fever.

## 2019-11-11 NOTE — ED Provider Notes (Signed)
[facility] EMERGENCY DEPARTMENT Provider Note   CSN: [id number] Arrival date & time: 11/11/19  0805     History Chief Complaint  Patient presents with  . Burn    Amy Matthews is a 39 y.o. female.  39yo F w/ PMH including T2DM, lumbar laminectomy who p/w burn.  2 evenings ago, the patient did vaginal steaming for the first time.  She did not have any problems or pain during this.  She walked around all day yesterday with no problems but late last night after she got out of the shower, her significant other noticed a burn on her left buttock.  She reports anesthesia in this area since her previous back surgery so she was not aware that the burn was there.  She denies any drainage, fevers, or other complaints.  The history is provided by the patient.  Burn      Past Medical History:  Diagnosis Date  . Diabetes mellitus without complication (HCC)   . HRP (high risk pregnancy)    prolonged hospitalization, cerclage done  . IUD (intrauterine device) in place 2006   Mirena  . Obesity   . Wears contact lenses     Patient Active Problem List   Diagnosis Date Noted  . BMI 39.0-39.9,adult 07/30/2019  . S/P lumbar laminectomy 05/02/2018  . Cauda equina syndrome (HCC) 05/02/2018  . Uncontrolled type 2 diabetes mellitus with hyperglycemia (HCC) 06/22/2017    Past Surgical History:  Procedure Laterality Date  . BACK SURGERY    . CERVICAL CERCLAGE    . CYST EXCISION  2004   under right breast  . DILATION AND CURETTAGE OF UTERUS  2010   due to miscarriage  . LUMBAR LAMINECTOMY/DECOMPRESSION MICRODISCECTOMY Left 05/02/2018   Procedure: LUMBAR LAMINECTOMY/DECOMPRESSION MICRODISCECTOMY, LUMBAR FIVE - SACRAL ONE LEFT;  Surgeon: Julio Sicks, MD;  Location: [facility] OR;  Service: Neurosurgery;  Laterality: Left;     OB History   No obstetric history on file.     Family History  Problem Relation Age of Onset  . Kidney disease Mother        dialysis  . Diabetes Mother     . Ulcers Father   . Heart disease Maternal Grandmother   . Other Maternal Grandfather        old age  . Dementia Paternal Grandmother   . Cancer Other        breast  . Hypertension Neg Hx   . Hyperlipidemia Neg Hx   . Stroke Neg Hx     Social History   Tobacco Use  . Smoking status: Never Smoker  . Smokeless tobacco: Never Used  Substance Use Topics  . Alcohol use: Yes    Comment: occ  . Drug use: No    Home Medications Prior to Admission medications   Medication Sig Start Date End Date Taking? Authorizing Provider  gabapentin (NEURONTIN) 600 MG tablet Take 600 mg by mouth 3 (three) times daily.    [provider]  LEVEMIR FLEXTOUCH 100 UNIT/ML Pen Inject 10 Units into the skin daily. 03/12/18   [provider]  metFORMIN (GLUCOPHAGE) 500 MG tablet Take 500 mg by mouth 2 (two) times daily. 03/20/18   [provider]  albuterol (PROVENTIL HFA;VENTOLIN HFA) 108 (90 BASE) MCG/ACT inhaler Inhale 2 puffs into the lungs every 6 (six) hours as needed for wheezing or shortness of breath. Patient not taking: Reported on 05/02/2018 09/19/14 07/29/19  Tysinger, Kermit Balo, PA-C    Allergies  Patient has no known allergies.  Review of Systems   Review of Systems All other systems reviewed and are negative except that which was mentioned in HPI  Physical Exam Updated Vital Signs BP (!) 153/96 (BP Location: Right Arm)   Pulse 89   Temp 98 F (36.7 C) (Oral)   Resp 14   Ht 5\' 5"  (1.651 m)   Wt 104.3 kg   LMP 11/02/2019   SpO2 99%   BMI 38.27 kg/m   Physical Exam Vitals and nursing note reviewed. Exam conducted with a chaperone present.  Constitutional:      General: She is not in acute distress.    Appearance: She is well-developed.  HENT:     Head: Normocephalic and atraumatic.  Eyes:     Conjunctiva/sclera: Conjunctivae normal.  Musculoskeletal:     Cervical back: Neck supple.  Skin:    General: Skin is warm and dry.     Comments: Circular  area of partial thickness burn with some skin sloughing on medial left lower buttock not involving perineum or vaginal introitus; no drainage or tenderness  Neurological:     Mental Status: She is alert and oriented to person, place, and time.  Psychiatric:        Judgment: Judgment normal.     ED Results / Procedures / Treatments   Labs (all labs ordered are listed, but only abnormal results are displayed) Labs Reviewed - No data to display  EKG None  Radiology No results found.  Procedures Procedures (including critical care time)  Medications Ordered in ED Medications  silver sulfADIAZINE (SILVADENE) 1 % cream ( Topical Given 11/11/19 0848)    ED Course  I have reviewed the triage vital signs and the nursing notes.     MDM Rules/Calculators/A&P                      Partial-thickness burn on left buttock, no signs of complications such as infection.  I discussed that this is a high risk area for infection especially given her underlying diabetes.  Provided with Silvadene cream and emphasized the importance of keeping area clean and dry, close monitoring for any signs of infection, and PCP follow-up.  Also emphasized importance of tight diabetes control to help with healing.  She voiced understanding. Final Clinical Impression(s) / ED Diagnoses Final diagnoses:  Burn of buttock, second degree, initial encounter    Rx / DC Orders ED Discharge Orders    None       Anshi Jalloh, Wenda Overland, MD 11/11/19 0900

## 2023-09-22 ENCOUNTER — Emergency Department (HOSPITAL_BASED_OUTPATIENT_CLINIC_OR_DEPARTMENT_OTHER)
Admission: EM | Admit: 2023-09-22 | Discharge: 2023-09-22 | Disposition: A | Discharge status: Home / Self Care | Payer: 59 | Attending: Emergency Medicine | Admitting: Emergency Medicine

## 2023-09-22 ENCOUNTER — Other Ambulatory Visit: Payer: Self-pay

## 2023-09-22 ENCOUNTER — Encounter (HOSPITAL_BASED_OUTPATIENT_CLINIC_OR_DEPARTMENT_OTHER): Payer: Self-pay

## 2023-09-22 ENCOUNTER — Telehealth (HOSPITAL_BASED_OUTPATIENT_CLINIC_OR_DEPARTMENT_OTHER): Payer: Self-pay | Admitting: Emergency Medicine

## 2023-09-22 DIAGNOSIS — M792 Neuralgia and neuritis, unspecified: Secondary | ICD-10-CM | POA: Diagnosis not present

## 2023-09-22 DIAGNOSIS — M79672 Pain in left foot: Secondary | ICD-10-CM | POA: Diagnosis present

## 2023-09-22 MED ORDER — HYDROCODONE-ACETAMINOPHEN 5-325 MG PO TABS
1.0000 | ORAL_TABLET | Freq: Once | ORAL | Status: AC
  Administered 2023-09-22: 1 via ORAL
  Filled 2023-09-22: qty 1

## 2023-09-22 MED ORDER — PREDNISONE 20 MG PO TABS
20.0000 mg | ORAL_TABLET | Freq: Once | ORAL | Status: AC
  Administered 2023-09-22: 20 mg via ORAL
  Filled 2023-09-22: qty 1

## 2023-09-22 MED ORDER — PREDNISONE 20 MG PO TABS: ORAL_TABLET | ORAL | 0 refills | Status: AC

## 2023-09-22 MED ORDER — HYDROCODONE-ACETAMINOPHEN 5-325 MG PO TABS: 1.0000 | ORAL_TABLET | Freq: Four times a day (QID) | ORAL | 0 refills | Status: AC | PRN

## 2023-09-22 NOTE — Discharge Instructions (Addendum)
 1.  At this time your pain appears to be due to a nerve irritation in the sole of your foot. 2.  You are being treated with a short course of prednisone to try to decrease inflammation and irritation.  Pay close attention to your blood sugars.  If your blood sugars are elevating, discontinue the prednisone and contact your doctor for instructions on increasing your insulin use temporarily.  You have also been given a short course of Vicodin to use for severe pain particularly at night. 3.  Consider scheduling a follow-up with a podiatrist.  They may be able to assist in identifying nerve compression and either doing injections or assisting with foot mechanics. 4.  Return to the emergency department if you have other concerning or worsening symptoms such as swelling, redness, weakness or other concerning changes.

## 2023-09-22 NOTE — ED Provider Notes (Signed)
 [place] EMERGENCY DEPARTMENT AT [facility] Provider Note   CSN: [id number] Arrival date & time: 09/22/23  0800     History  Chief Complaint  Patient presents with   Leg Pain    Amy Matthews is a 43 y.o. female.  HPI Patient reports yesterday she developed pain in the sole of her foot on the left.  Pain starts just in front of the heel and sends an intense lancinating pain up the leg to the level of her knee.  She reports that the leg feels absolutely fine except when she gets these lancinating pains which come sometimes at intervals of every 5 to 7 minutes.  She denies any difficulty walking.  It is not triggered by pressure or walking.  She completed usual tasks yesterday.  There was no injury.  She reports she had extreme difficulty sleeping last night because at regular intervals, a lancinating pain would come from the sole of her foot and radiate up her leg.  She reports that the pain resolves almost as soon as it is occurred but it kept her up all night.  She reports this happened about 7 times since she has been in the emergency department.  Patient reports that she had a back surgery a number of years ago and was told she might have some neuropathic pain.  She reports that she has never had pain that was similar or this bad.  She tried taking gabapentin from her prescription without any relief.  Patient denies any chest pain or shortness of breath.  No other associated symptoms.  Patient is diabetic.  She reports her A1c's have been fairly well-controlled.  Her last 1 was at about 7.5 and a little higher than the previous which had been at 6.5 range.  She reports she is being careful with her diet and medications currently and blood sugars are controlled by her Accu-Cheks.    Home Medications Prior to Admission medications   Medication Sig Start Date End Date Taking? Authorizing Provider  HYDROcodone-acetaminophen (NORCO/VICODIN) 5-325 MG tablet Take 1-2 tablets by  mouth every 6 (six) hours as needed. 09/22/23  Yes Arby Barrette, MD  predniSONE (DELTASONE) 20 MG tablet 2 tabs po daily x 4 days 09/22/23  Yes Eleesha Purkey, Lebron Conners, MD  gabapentin (NEURONTIN) 600 MG tablet Take 600 mg by mouth 3 (three) times daily.    [provider]  LEVEMIR FLEXTOUCH 100 UNIT/ML Pen Inject 10 Units into the skin daily. 03/12/18   [provider]  metFORMIN (GLUCOPHAGE) 500 MG tablet Take 500 mg by mouth 2 (two) times daily. 03/20/18   [provider]  albuterol (PROVENTIL HFA;VENTOLIN HFA) 108 (90 BASE) MCG/ACT inhaler Inhale 2 puffs into the lungs every 6 (six) hours as needed for wheezing or shortness of breath. Patient not taking: Reported on 05/02/2018 09/19/14 07/29/19  Tysinger, Kermit Balo, PA-C      Allergies    Losartan    Review of Systems   Review of Systems  Physical Exam Updated Vital Signs BP (!) 172/90 (BP Location: Right Arm)   Pulse 91   Temp 98.9 F (37.2 C) (Oral)   Resp 16   Ht 5\' 5"  (1.651 m)   Wt 106.6 kg   LMP 09/02/2023 (Approximate)   SpO2 100%   BMI 39.11 kg/m  Physical Exam Constitutional:      Comments: Alert nontoxic well in appearance  HENT:     Mouth/Throat:     Pharynx: Oropharynx is clear.  Cardiovascular:  Rate and Rhythm: Normal rate and regular rhythm.  Pulmonary:     Effort: Pulmonary effort is normal.     Breath sounds: Normal breath sounds.  Musculoskeletal:        General: No swelling, tenderness, deformity or signs of injury. Normal range of motion.     Right lower leg: No edema.     Left lower leg: No edema.     Comments: Bilateral lower extremities are symmetric.  Patient does not have any peripheral edema.  No effusions at the knees.  There is no reproducible pain to palpation in the popliteal fossa, calf, lower leg, ankle or foot.  Full examination of the foot and palpation of the sole is nontender.  Pain cannot be reproduced.  Dorsalis pedis pulses 2+ and strong.  The foot is warm and dry.   Excellent condition without any wounds.     ED Results / Procedures / Treatments   Labs (all labs ordered are listed, but only abnormal results are displayed) Labs Reviewed - No data to display  EKG None  Radiology No results found.  Procedures Procedures    Medications Ordered in ED Medications - No data to display  ED Course/ Medical Decision Making/ A&P                                 Medical Decision Making  Patient is experiencing intense lancinating pains suggestive of neuropathic pain.  Physical exam is completely normal with no signs of DVT\arterial insufficiency\wounds\cellulitis.  No associated injuries have occurred.  Patient's gait is unimpaired.  At this time I do not feel that further imaging will be helpful in establishing diagnosis.  Findings are consistent with neuropathic pain originating at the anterior aspect of the calcaneus.  I suspect a focal nerve compression or irritation.  We discussed treatment options.  Patient is currently trying to take Neurontin without relief.  We discussed a short course of prednisone.  Patient is diabetic but by description her blood sugars are well-controlled.  She will continue to monitor these and discontinue prednisone and contact her physician if blood sugars elevate.  I will give a short trial of Vicodin to assist over the next 2 days while taking prednisone.  We discussed trying to establish follow-up with podiatry to determine if there may be a focal nerve impingement that could be treated.        Final Clinical Impression(s) / ED Diagnoses Final diagnoses:  Neuralgia    Rx / DC Orders ED Discharge Orders          Ordered    predniSONE (DELTASONE) 20 MG tablet        09/22/23 0919    HYDROcodone-acetaminophen (NORCO/VICODIN) 5-325 MG tablet  Every 6 hours PRN        09/22/23 0919              Arby Barrette, MD 09/22/23 [phone]

## 2023-09-22 NOTE — ED Triage Notes (Signed)
 C/o shooting pain in left leg since yesterday from bottom of foot up calf. States was waking her up throughout the night. Denies injury.
